# Patient Record
Sex: Male | Born: 1955 | Race: White | Hispanic: No | Marital: Married | Smoking: Never smoker
Health system: Southern US, Community
[De-identification: ages and names within clinical notes are randomized; demographics above are authoritative.]

## PROBLEM LIST (undated history)

## (undated) DIAGNOSIS — I10 Essential (primary) hypertension: Secondary | ICD-10-CM

---

## 2009-08-06 HISTORY — PX: COLONOSCOPY: SHX174

## 2014-07-07 DIAGNOSIS — M545 Low back pain, unspecified: Secondary | ICD-10-CM | POA: Insufficient documentation

## 2015-05-17 DIAGNOSIS — K439 Ventral hernia without obstruction or gangrene: Secondary | ICD-10-CM | POA: Insufficient documentation

## 2015-05-17 DIAGNOSIS — I10 Essential (primary) hypertension: Secondary | ICD-10-CM | POA: Insufficient documentation

## 2015-05-17 DIAGNOSIS — F4322 Adjustment disorder with anxiety: Secondary | ICD-10-CM | POA: Insufficient documentation

## 2015-05-17 DIAGNOSIS — K648 Other hemorrhoids: Secondary | ICD-10-CM | POA: Insufficient documentation

## 2016-12-11 DIAGNOSIS — H40033 Anatomical narrow angle, bilateral: Secondary | ICD-10-CM | POA: Insufficient documentation

## 2016-12-11 DIAGNOSIS — H2182 Plateau iris syndrome (post-iridectomy) (postprocedural): Secondary | ICD-10-CM | POA: Insufficient documentation

## 2016-12-11 DIAGNOSIS — H25813 Combined forms of age-related cataract, bilateral: Secondary | ICD-10-CM | POA: Insufficient documentation

## 2017-09-26 ENCOUNTER — Telehealth: Payer: Self-pay | Admitting: Cardiology

## 2017-09-26 ENCOUNTER — Encounter (HOSPITAL_BASED_OUTPATIENT_CLINIC_OR_DEPARTMENT_OTHER): Payer: Self-pay | Admitting: Emergency Medicine

## 2017-09-26 ENCOUNTER — Emergency Department (HOSPITAL_BASED_OUTPATIENT_CLINIC_OR_DEPARTMENT_OTHER): Payer: BLUE CROSS/BLUE SHIELD

## 2017-09-26 ENCOUNTER — Other Ambulatory Visit: Payer: Self-pay

## 2017-09-26 ENCOUNTER — Emergency Department (HOSPITAL_BASED_OUTPATIENT_CLINIC_OR_DEPARTMENT_OTHER)
Admission: EM | Admit: 2017-09-26 | Discharge: 2017-09-26 | Disposition: A | Discharge status: Home / Self Care | Payer: BLUE CROSS/BLUE SHIELD | Attending: Emergency Medicine | Admitting: Emergency Medicine

## 2017-09-26 DIAGNOSIS — Z79899 Other long term (current) drug therapy: Secondary | ICD-10-CM | POA: Insufficient documentation

## 2017-09-26 DIAGNOSIS — R000 Tachycardia, unspecified: Secondary | ICD-10-CM | POA: Diagnosis present

## 2017-09-26 DIAGNOSIS — I10 Essential (primary) hypertension: Secondary | ICD-10-CM | POA: Diagnosis not present

## 2017-09-26 DIAGNOSIS — I4892 Unspecified atrial flutter: Secondary | ICD-10-CM | POA: Insufficient documentation

## 2017-09-26 HISTORY — DX: Essential (primary) hypertension: I10

## 2017-09-26 LAB — CBC WITH DIFFERENTIAL/PLATELET
BASOS PCT: 0 %
Basophils Absolute: 0 10*3/uL (ref 0.0–0.1)
Eosinophils Absolute: 0.2 10*3/uL (ref 0.0–0.7)
Eosinophils Relative: 3 %
HEMATOCRIT: 44.4 % (ref 39.0–52.0)
HEMOGLOBIN: 14.7 g/dL (ref 13.0–17.0)
LYMPHS ABS: 2.1 10*3/uL (ref 0.7–4.0)
Lymphocytes Relative: 32 %
MCH: 31.3 pg (ref 26.0–34.0)
MCHC: 33.1 g/dL (ref 30.0–36.0)
MCV: 94.7 fL (ref 78.0–100.0)
MONO ABS: 0.9 10*3/uL (ref 0.1–1.0)
MONOS PCT: 13 %
NEUTROS ABS: 3.5 10*3/uL (ref 1.7–7.7)
Neutrophils Relative %: 52 %
Platelets: 333 10*3/uL (ref 150–400)
RBC: 4.69 MIL/uL (ref 4.22–5.81)
RDW: 12.5 % (ref 11.5–15.5)
WBC: 6.7 10*3/uL (ref 4.0–10.5)

## 2017-09-26 LAB — BASIC METABOLIC PANEL
ANION GAP: 9 (ref 5–15)
BUN: 14 mg/dL (ref 6–20)
CALCIUM: 9 mg/dL (ref 8.9–10.3)
CHLORIDE: 103 mmol/L (ref 101–111)
CO2: 26 mmol/L (ref 22–32)
CREATININE: 0.98 mg/dL (ref 0.61–1.24)
GFR calc Af Amer: >60 mL/min (ref >60)
GFR calc non Af Amer: >60 mL/min (ref >60)
GLUCOSE: 90 mg/dL (ref 65–99)
Potassium: 3.6 mmol/L (ref 3.5–5.1)
Sodium: 138 mmol/L (ref 135–145)

## 2017-09-26 LAB — TROPONIN I: Troponin I: <0.03 ng/mL (ref <0.03)

## 2017-09-26 MED ORDER — METOPROLOL SUCCINATE ER 25 MG PO TB24: 25.0000 mg | ORAL_TABLET | Freq: Every day | ORAL | 0 refills | Status: DC

## 2017-09-26 MED ORDER — RIVAROXABAN 20 MG PO TABS
20.0000 mg | ORAL_TABLET | Freq: Once | ORAL | Status: AC
  Administered 2017-09-26: 20 mg via ORAL
  Filled 2017-09-26: qty 1

## 2017-09-26 MED ORDER — RIVAROXABAN 20 MG PO TABS: 20.0000 mg | ORAL_TABLET | Freq: Every day | ORAL | 0 refills | Status: DC

## 2017-09-26 MED ORDER — DEXTROSE 5 % IV SOLN
5.0000 mg/h | INTRAVENOUS | Status: DC
  Administered 2017-09-26: 5 mg/h via INTRAVENOUS
  Filled 2017-09-26: qty 100

## 2017-09-26 MED ORDER — SODIUM CHLORIDE 0.9 % IV BOLUS (SEPSIS)
500.0000 mL | Freq: Once | INTRAVENOUS | Status: AC
  Administered 2017-09-26: 500 mL via INTRAVENOUS

## 2017-09-26 MED ORDER — DILTIAZEM LOAD VIA INFUSION
15.0000 mg | Freq: Once | INTRAVENOUS | Status: AC
  Administered 2017-09-26: 15 mg via INTRAVENOUS
  Filled 2017-09-26: qty 15

## 2017-09-26 MED ORDER — METOPROLOL TARTRATE 5 MG/5ML IV SOLN
5.0000 mg | Freq: Once | INTRAVENOUS | Status: AC
  Administered 2017-09-26: 5 mg via INTRAVENOUS
  Filled 2017-09-26: qty 5

## 2017-09-26 MED ORDER — METOPROLOL TARTRATE 50 MG PO TABS
25.0000 mg | ORAL_TABLET | Freq: Once | ORAL | Status: AC
Start: 2017-09-26 — End: 2017-09-26
  Administered 2017-09-26: 25 mg via ORAL

## 2017-09-26 MED ORDER — METOPROLOL TARTRATE 50 MG PO TABS
ORAL_TABLET | ORAL | Status: AC
  Filled 2017-09-26: qty 1

## 2017-09-26 MED ORDER — IOPAMIDOL (ISOVUE-300) INJECTION 61%
100.0000 mL | Freq: Once | INTRAVENOUS | Status: AC | PRN
  Administered 2017-09-26: 100 mL via INTRAVENOUS

## 2017-09-26 MED ORDER — RIVAROXABAN (XARELTO) EDUCATION KIT FOR AFIB PATIENTS
PACK | Freq: Once | Status: DC
  Filled 2017-09-26: qty 1

## 2017-09-26 MED ORDER — METOPROLOL SUCCINATE ER 25 MG PO TB24
25.0000 mg | ORAL_TABLET | Freq: Every day | ORAL | Status: DC
  Filled 2017-09-26: qty 1

## 2017-09-26 NOTE — ED Triage Notes (Signed)
Patient states that he went to his MD today for congestion vs fluid on his lungs. When he was at the MD they told him his HR was high and sent him to the ER

## 2017-09-26 NOTE — ED Notes (Addendum)
Patient refusing cardizem.  States his daughter is a physician and states he should not take this medicaiton.  EDP made aware.

## 2017-09-26 NOTE — Telephone Encounter (Signed)
Called by ER about patient with new onset atrial flutter after developing URI sx over the past week.  Presented to ER and found to be in new onset atrial flutter.  Given Cardizem IV and Lopressor and now HR 100bpm. Patient does not want to stay and wants to go home.  No ischemic changes on EKG.  Trop negative.  He has some chest congestion.   Instructed ER to start patient on Toprol XL 25mg  daily and Xarelto 20mg  daily.  Please set up OV tomorrow in afib clinic.

## 2017-09-26 NOTE — ED Notes (Signed)
EDP notified of patients BP.

## 2017-09-26 NOTE — ED Provider Notes (Addendum)
[facility] EMERGENCY DEPARTMENT Provider Note   CSN: [id number] Arrival date & time: 09/26/17  1517     History   Chief Complaint Chief Complaint  Patient presents with  . Tachycardia    HPI Patrick Ferguson is a 62 y.o. male.  Patient is a 62 year old male with a history of hypertension who presents with cough and tachycardia.  He states he has had a 3-week history of a cough.  It started with runny nose congestion and coughing and now he just has a lingering cough.  It is productive of clear sputum.  He has some tightness across his chest which he describes as a normal feeling when he has congestion in his chest.  He has no known fevers.  He does feel short of breath at times and fatigued more than normal over the last couple of weeks.  He went to his PCPs office today to be assessed for possible pneumonia and at that point they found that his heart rate was elevated and sent him to the emergency room for further evaluation.  He denies any leg pain or swelling.  No history of arrhythmias.  He states he had a stress test as part of a normal physical in November including a 24-hour Holter monitor which was normal per his report.  He denies any sensation of palpitations or heart racing.      Past Medical History:  Diagnosis Date  . Hypertension     There are no active problems to display for this patient.   History reviewed. No pertinent surgical history.     Home Medications    Prior to Admission medications   Medication Sig Start Date End Date Taking? Authorizing Provider  losartan (COZAAR) 50 MG tablet Take 50 mg by mouth daily.    [provider]  metoprolol succinate (TOPROL-XL) 25 MG 24 hr tablet Take 1 tablet (25 mg total) by mouth daily. 09/26/17   Malvin Johns, MD  rivaroxaban (XARELTO) 20 MG TABS tablet Take 1 tablet (20 mg total) by mouth daily with supper. 09/26/17   Malvin Johns, MD    Family History History reviewed. No pertinent family  history.  Social History Social History   Tobacco Use  . Smoking status: Never Smoker  . Smokeless tobacco: Never Used  Substance Use Topics  . Alcohol use: No    Frequency: Never  . Drug use: No     Allergies   Patient has no known allergies.   Review of Systems Review of Systems  Constitutional: Positive for fatigue. Negative for chills, diaphoresis and fever.  HENT: Negative for congestion, rhinorrhea and sneezing.   Eyes: Negative.   Respiratory: Positive for cough, chest tightness and shortness of breath.   Cardiovascular: Negative for chest pain and leg swelling.  Gastrointestinal: Negative for abdominal pain, blood in stool, diarrhea, nausea and vomiting.  Genitourinary: Negative for difficulty urinating, flank pain, frequency and hematuria.  Musculoskeletal: Negative for arthralgias and back pain.  Skin: Negative for rash.  Neurological: Negative for dizziness, speech difficulty, weakness, numbness and headaches.     Physical Exam Updated Vital Signs BP (!) 126/99   Pulse 66   Temp 98.3 F (36.8 C) (Oral)   Resp 15   Ht _0  (1.803 m)   Wt 82.6 kg (182 lb)   SpO2 98%   BMI 25.38 kg/m   Physical Exam  Constitutional: He is oriented to person, place, and time. He appears well-developed and well-nourished.  HENT:  Head: Normocephalic  and atraumatic.  Eyes: Pupils are equal, round, and reactive to light.  Neck: Normal range of motion. Neck supple.  Cardiovascular: Regular rhythm and normal heart sounds. Tachycardia present.  Pulmonary/Chest: Effort normal and breath sounds normal. No respiratory distress. He has no wheezes. He has no rales. He exhibits no tenderness.  Abdominal: Soft. Bowel sounds are normal. There is no tenderness. There is no rebound and no guarding.  Musculoskeletal: Normal range of motion. He exhibits no edema.  Lymphadenopathy:    He has no cervical adenopathy.  Neurological: He is alert and oriented to person, place, and time.    Skin: Skin is warm and dry. No rash noted.  Psychiatric: He has a normal mood and affect.     ED Treatments / Results  Labs (all labs ordered are listed, but only abnormal results are displayed) Labs Reviewed  BASIC METABOLIC PANEL  CBC WITH DIFFERENTIAL/PLATELET  TROPONIN I    EKG  EKG Interpretation  Date/Time:  Thursday September 26 2017 15:26:01 EST Ventricular Rate:  138 PR Interval:  164 QRS Duration: 134 QT Interval:  266 QTC Calculation: 402 R Axis:   31 Text Interpretation:  Unusual P axis, possible ectopic atrial tachycardia Non-specific intra-ventricular conduction block Nonspecific T wave abnormality Abnormal ECG No old tracing to compare Confirmed by Malvin Johns [phone]) on 09/26/2017 3:33:49 PM       Radiology Dg Chest 2 View  Result Date: 09/26/2017 CLINICAL DATA:  Cough and fatigue for several weeks EXAM: CHEST  2 VIEW COMPARISON:  None. FINDINGS: Cardiac shadow is within normal limits. The lungs are well aerated bilaterally. No definitive infiltrate is noted. Some increased density is noted in the right paratracheal region projecting over the aortic arch on the lateral projection. This may simply represent vascular structures although the possibility of an underlying lesion could not be totally excluded. CT T is recommended for further evaluation. IMPRESSION: Changes in the right paratracheal region. CT of the chest with contrast is recommended. Electronically Signed   By: Inez Catalina M.D.   On: 09/26/2017 16:04   Ct Chest W Contrast  Result Date: 09/26/2017 CLINICAL DATA:  62 year old male with cough and fatigue for several weeks. Possible right paratracheal abnormality on chest radiographs today. EXAM: CT CHEST WITH CONTRAST TECHNIQUE: Multidetector CT imaging of the chest was performed during intravenous contrast administration. CONTRAST:  75 milliliters ISOVUE-300 IOPAMIDOL (ISOVUE-300) INJECTION 61% COMPARISON:  Chest radiographs at 1614 hr today, with no  earlier studies for comparison. FINDINGS: Cardiovascular: Negative visible aorta. The proximal great vessels appear normal. Evidence of calcified left coronary artery atherosclerosis on series 2, image 90. The other major mediastinal vascular structures appear within normal limits. Borderline to mild cardiomegaly. No pericardial effusion. Mediastinum/Nodes: Negative. No lymphadenopathy. Unremarkable thoracic inlet. Lungs/Pleura: The major airways are patent. There is no medial right upper lobe or right paratracheal pulmonary abnormality. The radiographic appearance from earlier today was artifact. There is minimal dependent opacity in both lungs due to atelectasis. Otherwise both lungs are clear. No pleural effusion. Upper Abdomen: Negative visible liver, gallbladder, spleen, pancreas, adrenal glands, and bowel in the upper abdomen. There is a large left mid and upper pole renal cyst encompassing about 8 centimeters diameter which has simple fluid density aside from several small calcified septations (series 5, image 67). This appears benign. There is a partially visible low-density right renal midpole cyst laterally which is also likely benign. No upper abdominal lymphadenopathy. Musculoskeletal: Degenerative changes in the upper lumbar spine. No acute osseous abnormality  identified. IMPRESSION: 1. Negative Chest CT aside from evidence of left coronary artery calcified atherosclerosis. 2. The right paratracheal radiographic finding earlier today was artifact. 3. Large left upper pole renal cyst which appears benign, several thin calcified septae within the cyst are noted. Electronically Signed   By: Genevie Ann M.D.   On: 09/26/2017 17:19    Procedures Procedures (including critical care time)  Medications Ordered in ED Medications  diltiazem (CARDIZEM) 1 mg/mL load via infusion 15 mg (15 mg Intravenous Bolus from Bag 09/26/17 1746)    And  diltiazem (CARDIZEM) 100 mg in dextrose 5 % 100 mL (1 mg/mL) infusion  (0 mg/hr Intravenous Stopped 09/26/17 2008)  rivaroxaban (XARELTO) Education Kit for Afib patients ( Does not apply Not Given 09/26/17 2025)  sodium chloride 0.9 % bolus 500 mL (0 mLs Intravenous Stopped 09/26/17 1746)  iopamidol (ISOVUE-300) 61 % injection 100 mL (100 mLs Intravenous Contrast Given 09/26/17 1653)  metoprolol tartrate (LOPRESSOR) injection 5 mg (5 mg Intravenous Given 09/26/17 1831)  rivaroxaban (XARELTO) tablet 20 mg (20 mg Oral Given 09/26/17 2017)  metoprolol tartrate (LOPRESSOR) tablet 25 mg (25 mg Oral Given 09/26/17 2018)     Initial Impression / Assessment and Plan / ED Course  I have reviewed the triage vital signs and the nursing notes.  Pertinent labs & imaging results that were available during my care of the patient were reviewed by me and considered in my medical decision making (see chart for details).  Clinical Course as of Sep 26 2024  Thu Sep 26, 2017  1640 PT has tachycardia in the setting of recent cough/chest congestion.  EKG appears to be possible atrial tachycardia vs a-flutter.  Cardizem ordered, but pt talked to his daughter who is an ob/gyn resident who wants to try fluids first.  Giving 500cc bolus.  Cxr reviewed and shows fullness to paratracheal area, CT recommended.  Pt updated  [MB]  1742 No change in HR after fluids.  Had a discussion with the pt's daughter regarding starting cardizem and she wants to make some phone calls prior to starting this.  I explained that this is generally a rate control medication rather than a conversion medication but occasionally patients will convert as the HR improves.    [MB]  1857 Pt agreed to cardizem, no change in HR after bolus and drip.  Given lopressor 9m IV, HR slowed to low 100s, in atrial flutter.    [MB]    Clinical Course User Index [MB] BMalvin Johns MD    Patient is a 62year old male who presents in a flutter with RVR.  He currently is asymptomatic.  There is no signs of CHF.  He initially was  reluctant to start rate control medications but then did agree to this.  He was started on Cardizem with minimal change in heart rate.  He was given a dose of Lopressor and his heart rate improved nicely down into the low 100s.  He remains asymptomatic.  I discussed with him admission and patient is reluctant to be admitted.  He he wants to see if he can go home.  I spoke with Dr. TRadford Paxwith cardiology who feels that this would be appropriate.  She recommends starting him on Toprol 25 mg daily as well as Xarelto.  I discussed these medications and the side effects.  Dr. TRadford Paxis sending a note to the A. fib clinic to hopefully get him in to be seen tomorrow.  I did advise the patient that  if he does not hear from the clinic by 11 AM to call the clinic for an appointment tomorrow.  Return precautions were given.  CRITICAL CARE Performed by: Malvin Johns Total critical care time: 90 minutes Critical care time was exclusive of separately billable procedures and treating other patients. Critical care was necessary to treat or prevent imminent or life-threatening deterioration. Critical care was time spent personally by me on the following activities: development of treatment plan with patient and/or surrogate as well as nursing, discussions with consultants, evaluation of patient's response to treatment, examination of patient, obtaining history from patient or surrogate, ordering and performing treatments and interventions, ordering and review of laboratory studies, ordering and review of radiographic studies, pulse oximetry and re-evaluation of patient's condition.  This patients CHA2DS2-VASc Score and unadjusted Ischemic Stroke Rate (% per year) is equal to 0.6 % stroke rate/year from a score of 1  Above score calculated as 1 point each if present [CHF, HTN, DM, Vascular=MI/PAD/Aortic Plaque, Age if 65-74, or Male] Above score calculated as 2 points each if present [Age > 75, or  Stroke/TIA/TE]     Final Clinical Impressions(s) / ED Diagnoses   Final diagnoses:  Atrial flutter with rapid ventricular response [facility])    ED Discharge Orders        Ordered    metoprolol succinate (TOPROL-XL) 25 MG 24 hr tablet  Daily     09/26/17 1956    rivaroxaban (XARELTO) 20 MG TABS tablet  Daily with supper     09/26/17 1956       Malvin Johns, MD 09/26/17 1958    Malvin Johns, MD 09/26/17 2026

## 2017-09-26 NOTE — ED Notes (Signed)
Per EDP hold on cardizem.  IV fluids ordered and given.

## 2017-09-26 NOTE — ED Notes (Signed)
ED Provider at bedside. 

## 2017-09-26 NOTE — ED Notes (Signed)
Patient continues to refuse cardizem.  States "I have to make some phone calls". EDP aware.

## 2017-09-27 ENCOUNTER — Ambulatory Visit (INDEPENDENT_AMBULATORY_CARE_PROVIDER_SITE_OTHER): Payer: BLUE CROSS/BLUE SHIELD | Admitting: Internal Medicine

## 2017-09-27 ENCOUNTER — Encounter: Payer: Self-pay | Admitting: Internal Medicine

## 2017-09-27 VITALS — BP 132/96 | HR 136 | Ht 71.0 in | Wt 185.8 lb

## 2017-09-27 DIAGNOSIS — I2510 Atherosclerotic heart disease of native coronary artery without angina pectoris: Secondary | ICD-10-CM

## 2017-09-27 DIAGNOSIS — I10 Essential (primary) hypertension: Secondary | ICD-10-CM | POA: Diagnosis not present

## 2017-09-27 DIAGNOSIS — E782 Mixed hyperlipidemia: Secondary | ICD-10-CM

## 2017-09-27 DIAGNOSIS — I4892 Unspecified atrial flutter: Secondary | ICD-10-CM | POA: Diagnosis not present

## 2017-09-27 MED ORDER — METOPROLOL TARTRATE 50 MG PO TABS: 50.0000 mg | ORAL_TABLET | Freq: Two times a day (BID) | ORAL | 3 refills | Status: DC

## 2017-09-27 MED ORDER — RIVAROXABAN 20 MG PO TABS: 20.0000 mg | ORAL_TABLET | Freq: Every day | ORAL | 0 refills | Status: DC

## 2017-09-27 NOTE — Telephone Encounter (Signed)
A. FIB clinic is not open today. Dr. Radford Pax stated patient needed to be seen by DOD or APP today. Made patient an appointment with Dr. Harrington Challenger, [place], today. Sent message to [facility] for records.

## 2017-09-27 NOTE — Progress Notes (Signed)
Cardiology Office Note   Date:  09/27/2017   ID:  Patrick Ferguson, DOB Sep 03, 1955, MRN [id number]  PCP:  Deland Pretty, MD  Cardiologist:   Dorris Carnes, MD    [name]    History of Present Illness: Patrick Ferguson is a 62 y.o. male with a history of HTN  Seen yesterday in ED at [facility] yesterday (2/21)  He had 3 wk hsitory of cough, runny nose.  Had some tightnessattrib to congeston.  Did complain of SOB with exertion.   Went to PCP office prior to ED and sent to ED   Found to be in atrial flutter  He denied palpitations  .   Given cardiazem and lopressor and HR improved  DId not want to stay.  Troponin negative  He was discharged on Toprol XL 25 and Xarelto 20    Since ED visit he has not started meds   He has not taken bp meds before iether (even  Though prescribed)  He denies ddzziness  No palpitations  Breathing is fair       Current Meds  Medication Sig  . losartan (COZAAR) 50 MG tablet Take 50 mg by mouth daily.  . metoprolol succinate (TOPROL-XL) 25 MG 24 hr tablet Take 1 tablet (25 mg total) by mouth daily.  . rivaroxaban (XARELTO) 20 MG TABS tablet Take 1 tablet (20 mg total) by mouth daily with supper.     Allergies:   Patient has no known allergies.   Past Medical History:  Diagnosis Date  . Hypertension     History reviewed. No pertinent surgical history.   Social History:  The patient  reports that  has never smoked. he has never used smokeless tobacco. He reports that he does not drink alcohol or use drugs.   Family History:  The patient's family history is not on file.    ROS:  Please see the history of present illness. All other systems are reviewed and  Negative to the above problem except as noted.    PHYSICAL EXAM: VS:  BP (!) 132/96   Pulse (!) 136   Ht 5\' 11"  (1.803 m)   Wt 185 lb 12.8 oz (84.3 kg)   BMI 25.91 kg/m   GEN: Well nourished, well developed, in no acute distress  HEENT:  normal  Neck: JVP normal  carotid bruits, or masses Cardiac: RRR  Tachy ; no murmurs, rubs, or gallops,no edema  Respiratory:  clear to auscultation bilaterally, normal work of breathing GI: soft, nontender, nondistended, + BS  No hepatomegaly  MS: no deformity Moving all extremities   Skin: warm and dry, no rash Neuro:  Strength and sensation are intact Psych: euthymic mood, full affect   EKG:  EKG is ordered today.   Lipid Panel No results found for: CHOL, TRIG, HDL, CHOLHDL, VLDL, LDLCALC, LDLDIRECT    Wt Readings from Last 3 Encounters:  09/27/17 185 lb 12.8 oz (84.3 kg)  09/26/17 182 lb (82.6 kg)      ASSESSMENT AND PLAN:  1  Atrial flutter . Discussed pathophysiology and Rx options at length with pt and his daugher (an OB GYN resident)   Answered questions   Plan for cardioversion in a few wks    Pt with CHADSVASC score of 2 (HTN, CAD on CT)   I would reocmm Xarelto and metoprolol   He has not taken either yet and is still tachy. I would recomm  starting metoprolol 25 tid then increase to 50 bid Follow BP and pulse    Schedule echo.     Plan for cardioversion 3 to 4 wks from starting xarelto  Will see in clnic after cardioversion and discuss options for anticoag as well as ablation in future.  2  Coronary calcifications    CT shows calcifications of coronary arteries   He is not symptomatic  3  Lipids   Will set up for fasting    4  HTN  Follow on Rx      Current medicines are reviewed at length with the patient today.  The patient does not have concerns regarding medicines.  Signed, Dorris Carnes, MD  09/27/2017 11:18 AM    [name], [place], [place]  [address] Phone: [phone]; Fax: [phone]

## 2017-09-27 NOTE — H&P (View-Only) (Signed)
Cardiology Office Note   Date:  09/27/2017   ID:  Patrick Ferguson, DOB Nov 20, 1955, MRN [id number]  PCP:  Deland Pretty, MD  Cardiologist:   Dorris Carnes, MD    [name]    History of Present Illness: Patrick Ferguson is a 62 y.o. male with a history of HTN  Seen yesterday in ED at [facility] yesterday (2/21)  He had 3 wk hsitory of cough, runny nose.  Had some tightnessattrib to congeston.  Did complain of SOB with exertion.   Went to PCP office prior to ED and sent to ED   Found to be in atrial flutter  He denied palpitations  .   Given cardiazem and lopressor and HR improved  DId not want to stay.  Troponin negative  He was discharged on Toprol XL 25 and Xarelto 20    Since ED visit he has not started meds   He has not taken bp meds before iether (even  Though prescribed)  He denies ddzziness  No palpitations  Breathing is fair       Current Meds  Medication Sig  . losartan (COZAAR) 50 MG tablet Take 50 mg by mouth daily.  . metoprolol succinate (TOPROL-XL) 25 MG 24 hr tablet Take 1 tablet (25 mg total) by mouth daily.  . rivaroxaban (XARELTO) 20 MG TABS tablet Take 1 tablet (20 mg total) by mouth daily with supper.     Allergies:   Patient has no known allergies.   Past Medical History:  Diagnosis Date  . Hypertension     History reviewed. No pertinent surgical history.   Social History:  The patient  reports that  has never smoked. he has never used smokeless tobacco. He reports that he does not drink alcohol or use drugs.   Family History:  The patient's family history is not on file.    ROS:  Please see the history of present illness. All other systems are reviewed and  Negative to the above problem except as noted.    PHYSICAL EXAM: VS:  BP (!) 132/96   Pulse (!) 136   Ht 5\' 11"  (1.803 m)   Wt 185 lb 12.8 oz (84.3 kg)   BMI 25.91 kg/m   GEN: Well nourished, well developed, in no acute distress  HEENT:  normal  Neck: JVP normal  carotid bruits, or masses Cardiac: RRR  Tachy ; no murmurs, rubs, or gallops,no edema  Respiratory:  clear to auscultation bilaterally, normal work of breathing GI: soft, nontender, nondistended, + BS  No hepatomegaly  MS: no deformity Moving all extremities   Skin: warm and dry, no rash Neuro:  Strength and sensation are intact Psych: euthymic mood, full affect   EKG:  EKG is ordered today.   Lipid Panel No results found for: CHOL, TRIG, HDL, CHOLHDL, VLDL, LDLCALC, LDLDIRECT    Wt Readings from Last 3 Encounters:  09/27/17 185 lb 12.8 oz (84.3 kg)  09/26/17 182 lb (82.6 kg)      ASSESSMENT AND PLAN:  1  Atrial flutter . Discussed pathophysiology and Rx options at length with pt and his daugher (an OB GYN resident)   Answered questions   Plan for cardioversion in a few wks    Pt with CHADSVASC score of 2 (HTN, CAD on CT)   I would reocmm Xarelto and metoprolol   He has not taken either yet and is still tachy. I would recomm  starting metoprolol 25 tid then increase to 50 bid Follow BP and pulse    Schedule echo.     Plan for cardioversion 3 to 4 wks from starting xarelto  Will see in clnic after cardioversion and discuss options for anticoag as well as ablation in future.  2  Coronary calcifications    CT shows calcifications of coronary arteries   He is not symptomatic  3  Lipids   Will set up for fasting    4  HTN  Follow on Rx      Current medicines are reviewed at length with the patient today.  The patient does not have concerns regarding medicines.  Signed, Dorris Carnes, MD  09/27/2017 11:18 AM    [address], [place], [place]  [address] Phone: [phone]; Fax: [phone]

## 2017-09-27 NOTE — Patient Instructions (Signed)
Medication Instructions:  Your physician has recommended you make the following change in your medication:  1.) stop metoprolol succinate 2.) start metoprolol tartrate 50 mg twice daily---take 1/2 tablet for first dose Take xarelto 20 mg with supper daily.  Do not miss any doses.   Labwork: Your physician recommends that you return for lab work when you come for echo appointment (lipids)   Testing/Procedures: Your physician has requested that you have an echocardiogram. Echocardiography is a painless test that uses sound waves to create images of your heart. It provides your doctor with information about the size and shape of your heart and how well your heart's chambers and valves are working. This procedure takes approximately one hour. There are no restrictions for this procedure.    Follow-Up: Your physician has recommended that you have a Cardioversion (DCCV). Electrical Cardioversion uses a jolt of electricity to your heart either through paddles or wired patches attached to your chest. This is a controlled, usually prescheduled, procedure. Defibrillation is done under light anesthesia in the hospital, and you usually go home the day of the procedure. This is done to get your heart back into a normal rhythm. You are not awake for the procedure. Please see the instruction sheet given to you today.  We will plan this for in about 3-4 weeks.  We will schedule a follow up with for you with Dr. Harrington Challenger about 3 weeks after cardioversion.      Any Other Special Instructions Will Be Listed Below (If Applicable).     If you need a refill on your cardiac medications before your next appointment, please call your pharmacy.

## 2017-10-02 ENCOUNTER — Telehealth: Payer: Self-pay | Admitting: Internal Medicine

## 2017-10-02 NOTE — Telephone Encounter (Signed)
Spoke with pt and daughter regarding pt . Pt was seen in the ER on 2/21. Pt was diagnosed with A-flutter . Pt was prescribed Metoprolol tartrate 25 mg BID then. Pt was seen by Dr Harrington Challenger on 09/27/17. Pt was still on A-flutter rate of 136 beats/minute, BP 132/96. Metoprolol tartrate dose was increased to 50 mg BID. Pt started take the 50 mg this past Sunday. Pt responded well at the beginning to the increased dose. HR was in the lowest 90's. Last night and this morning prior going to work pt's HR was 135 beats/minute BP 127/105. No other symptoms, pt feels fine.  Pt would like to know if he can increased the Metoprolol dose.  Dr. Rayann Heman DOD aware of pt's increased HR. DOD  recommends to increased the Metoprolol tartrate to 75 mg BID. pt's daughter aware, she verbalized understanding.

## 2017-10-02 NOTE — Telephone Encounter (Signed)
New message     Daughter calling with HR and BP concerns   STAT if HR is under 50 or over 120 (normal HR is 60-100 beats per minute)  1) What is your heart rate? 135 BP 127/105  2) Do you have a log of your heart rate readings (document readings)?   3) Do you have any other symptoms? NO

## 2017-10-02 NOTE — Telephone Encounter (Signed)
Spoke to pt  Agree with 75 bid He wll email in at end of week\\  Will need to confirm time for cardioversion (3-4 wks from initiating NOAC)

## 2017-10-03 MED ORDER — METOPROLOL TARTRATE 25 MG PO TABS: ORAL_TABLET | ORAL | 3 refills | Status: DC

## 2017-10-03 NOTE — Telephone Encounter (Signed)
Sent a prescription for 25 mg tablet to [facility].  Pt can take 25 mg in addition to the 50 mg tablets he already has. Called patient and informed.

## 2017-10-03 NOTE — Telephone Encounter (Signed)
He will email at end of week with blood pressures and HR.

## 2017-10-04 ENCOUNTER — Encounter: Payer: Self-pay | Admitting: Internal Medicine

## 2017-10-04 ENCOUNTER — Telehealth: Payer: Self-pay | Admitting: Internal Medicine

## 2017-10-04 NOTE — Telephone Encounter (Signed)
Patient calling,   STAT if HR is under 50 or over 120 (normal HR is 60-100 beats per minute)  1) What is your heart rate? 132 and 131   2) Do you have a log of your heart rate readings (document readings)? yes  3) Do you have any other symptoms? Fatigue   BP last four: 124/93 hr 132 / 124/96 hr 131/ 124/93 hr 132 / 129/95 HR 131   Patient would like to know if Metoprolol should be adjusted?

## 2017-10-07 ENCOUNTER — Encounter: Payer: Self-pay | Admitting: Internal Medicine

## 2017-10-07 ENCOUNTER — Encounter: Payer: Self-pay | Admitting: *Deleted

## 2017-10-07 NOTE — Telephone Encounter (Signed)
Spoke to pt earlier Will increase lopressor to 100 bid Tentatively sched for TEE/ Cardioversion on Thursday this week

## 2017-10-07 NOTE — Progress Notes (Signed)
Patient has been set up for TEE/Cardioversion for Thurs 10/10/17 per Dr. Harrington Challenger. He is aware of all instructions, letter has been set to him via MyChart also. He is working on someone being able to wait for him and take him home.   He will have labs drawn prior to procedure that morning, arriving 90 min early. Message sent to [employer].  Advised to call office with any questions/concerns in the meantime.

## 2017-10-10 ENCOUNTER — Ambulatory Visit (HOSPITAL_COMMUNITY)
Admission: RE | Admit: 2017-10-10 | Discharge: 2017-10-10 | Disposition: A | Discharge status: Home / Self Care | Payer: BLUE CROSS/BLUE SHIELD | Source: Ambulatory Visit | Attending: Cardiology | Admitting: Cardiology

## 2017-10-10 ENCOUNTER — Ambulatory Visit (HOSPITAL_BASED_OUTPATIENT_CLINIC_OR_DEPARTMENT_OTHER): Payer: BLUE CROSS/BLUE SHIELD

## 2017-10-10 ENCOUNTER — Encounter (HOSPITAL_COMMUNITY)
Admission: RE | Disposition: A | Discharge status: Home / Self Care | Payer: Self-pay | Source: Ambulatory Visit | Attending: Cardiology

## 2017-10-10 ENCOUNTER — Telehealth: Payer: Self-pay | Admitting: Internal Medicine

## 2017-10-10 ENCOUNTER — Other Ambulatory Visit: Payer: BLUE CROSS/BLUE SHIELD

## 2017-10-10 ENCOUNTER — Other Ambulatory Visit: Payer: Self-pay | Admitting: Cardiology

## 2017-10-10 ENCOUNTER — Ambulatory Visit (HOSPITAL_COMMUNITY): Payer: BLUE CROSS/BLUE SHIELD

## 2017-10-10 ENCOUNTER — Encounter (HOSPITAL_COMMUNITY): Payer: Self-pay | Admitting: *Deleted

## 2017-10-10 ENCOUNTER — Other Ambulatory Visit: Payer: Self-pay

## 2017-10-10 DIAGNOSIS — I10 Essential (primary) hypertension: Secondary | ICD-10-CM | POA: Insufficient documentation

## 2017-10-10 DIAGNOSIS — I4892 Unspecified atrial flutter: Secondary | ICD-10-CM | POA: Insufficient documentation

## 2017-10-10 DIAGNOSIS — Z7901 Long term (current) use of anticoagulants: Secondary | ICD-10-CM | POA: Insufficient documentation

## 2017-10-10 DIAGNOSIS — R0989 Other specified symptoms and signs involving the circulatory and respiratory systems: Secondary | ICD-10-CM

## 2017-10-10 DIAGNOSIS — I2510 Atherosclerotic heart disease of native coronary artery without angina pectoris: Secondary | ICD-10-CM | POA: Diagnosis not present

## 2017-10-10 DIAGNOSIS — I340 Nonrheumatic mitral (valve) insufficiency: Secondary | ICD-10-CM | POA: Diagnosis not present

## 2017-10-10 DIAGNOSIS — Z79899 Other long term (current) drug therapy: Secondary | ICD-10-CM | POA: Diagnosis not present

## 2017-10-10 DIAGNOSIS — I4891 Unspecified atrial fibrillation: Secondary | ICD-10-CM | POA: Insufficient documentation

## 2017-10-10 HISTORY — PX: CARDIOVERSION: SHX1299

## 2017-10-10 HISTORY — PX: TEE WITHOUT CARDIOVERSION: SHX5443

## 2017-10-10 LAB — POCT I-STAT, CHEM 8
BUN: 17 mg/dL (ref 6–20)
CALCIUM ION: 1.17 mmol/L (ref 1.15–1.40)
CHLORIDE: 107 mmol/L (ref 101–111)
Creatinine, Ser: 1.3 mg/dL — ABNORMAL HIGH (ref 0.61–1.24)
GLUCOSE: 101 mg/dL — AB (ref 65–99)
HCT: 44 % (ref 39.0–52.0)
Hemoglobin: 15 g/dL (ref 13.0–17.0)
Potassium: 4.3 mmol/L (ref 3.5–5.1)
SODIUM: 142 mmol/L (ref 135–145)
TCO2: 24 mmol/L (ref 22–32)

## 2017-10-10 SURGERY — ECHOCARDIOGRAM, TRANSESOPHAGEAL
Anesthesia: Monitor Anesthesia Care

## 2017-10-10 MED ORDER — SODIUM CHLORIDE 0.9 % IV SOLN
INTRAVENOUS | Status: DC
  Administered 2017-10-10: 500 mL via INTRAVENOUS

## 2017-10-10 MED ORDER — SODIUM CHLORIDE 0.9 % IV SOLN
INTRAVENOUS | Status: DC | PRN
  Administered 2017-10-10: 11:00:00 via INTRAVENOUS

## 2017-10-10 MED ORDER — PROPOFOL 500 MG/50ML IV EMUL
INTRAVENOUS | Status: DC | PRN
  Administered 2017-10-10: 120 ug/kg/min via INTRAVENOUS

## 2017-10-10 MED ORDER — BUTAMBEN-TETRACAINE-BENZOCAINE 2-2-14 % EX AERO
INHALATION_SPRAY | CUTANEOUS | Status: DC | PRN
  Administered 2017-10-10: 2 via TOPICAL

## 2017-10-10 NOTE — Procedures (Signed)
Electrical Cardioversion Procedure Note Patrick Ferguson [id number] 1956/08/06  Procedure: Electrical Cardioversion Indications:  Atrial Flutter  Procedure Details Consent: Risks of procedure as well as the alternatives and risks of each were explained to the (patient/caregiver).  Consent for procedure obtained. Time Out: Verified patient identification, verified procedure, site/side was marked, verified correct patient position, special equipment/implants available, medications/allergies/relevent history reviewed, required imaging and test results available.  Performed  Patient placed on cardiac monitor, pulse oximetry, supplemental oxygen as necessary.  Sedation given: Propofol per anesthesiology Pacer pads placed anterior and posterior chest.  Cardioverted 1 time(s).  Cardioverted at [place].  Evaluation Findings: Post procedure EKG shows: NSR Complications: None Patient did tolerate procedure well.   Loralie Champagne 10/10/2017, 11:33 AM

## 2017-10-10 NOTE — Telephone Encounter (Signed)
Dr. Harrington Challenger called patient back this afternoon.

## 2017-10-10 NOTE — Interval H&P Note (Signed)
History and Physical Interval Note:  10/10/2017 11:18 AM  Patrick Ferguson  has presented today for surgery, with the diagnosis of AFLUTTER  The various methods of treatment have been discussed with the patient and family. After consideration of risks, benefits and other options for treatment, the patient has consented to  Procedure(s): TRANSESOPHAGEAL ECHOCARDIOGRAM (TEE) (N/A) CARDIOVERSION (N/A) as a surgical intervention .  The patient's history has been reviewed, patient examined, no change in status, stable for surgery.  I have reviewed the patient's chart and labs.  Questions were answered to the patient's satisfaction.     Dalton [employer]

## 2017-10-10 NOTE — CV Procedure (Signed)
Procedure: TEE  Indication: atrial fibrillation  Sedation: Per anesthesiology.  Findings: Please see echo section for full report.  Normal LV size with global hypokinesis, EF 30%, in the setting of atrial flutter with RVR.  Normal RV size with mild to moderately decreased systolic function.  Mild TR.  Mild MR.  Trileaflet aortic valve with no stenosis or regurgitation  Mild left atrial enlargement, no LA appendage thrombus.  Normal right atrium.  Normal caliber aorta with mild plaque.  No PFO or ASD by color doppler.   May proceed to DCCV.   Loralie Champagne 10/10/2017 11:33 AM

## 2017-10-10 NOTE — Progress Notes (Signed)
  Echocardiogram Echocardiogram Transesophageal has been performed.  Darlina Sicilian M 10/10/2017, 11:52 AM

## 2017-10-10 NOTE — Telephone Encounter (Signed)
Patient calling states that he had Cardioversion today and was told to contact Dr. Harrington Challenger office

## 2017-10-10 NOTE — Discharge Instructions (Signed)
Electrical Cardioversion, Care After °This sheet gives you information about how to care for yourself after your procedure. Your health care provider may also give you more specific instructions. If you have problems or questions, contact your health care provider. °What can I expect after the procedure? °After the procedure, it is common to have: °· Some redness on the skin where the shocks were given. ° °Follow these instructions at home: °· Do not drive for 24 hours if you were given a medicine to help you relax (sedative). °· Take over-the-counter and prescription medicines only as told by your health care provider. °· Ask your health care provider how to check your pulse. Check it often. °· Rest for 48 hours after the procedure or as told by your health care provider. °· Avoid or limit your caffeine use as told by your health care provider. °Contact a health care provider if: °· You feel like your heart is beating too quickly or your pulse is not regular. °· You have a serious muscle cramp that does not go away. °Get help right away if: °· You have discomfort in your chest. °· You are dizzy or you feel faint. °· You have trouble breathing or you are short of breath. °· Your speech is slurred. °· You have trouble moving an arm or leg on one side of your body. °· Your fingers or toes turn cold or blue. °This information is not intended to replace advice given to you by your health care provider. Make sure you discuss any questions you have with your health care provider. °Document Released: 05/13/2013 Document Revised: 02/24/2016 Document Reviewed: 01/27/2016 °Elsevier Interactive Patient Education © 2018 [employer] Inc[employer] ° °

## 2017-10-10 NOTE — Anesthesia Procedure Notes (Signed)
Procedure Name: MAC Date/Time: 10/10/2017 11:06 AM Performed by: Kyung Rudd, CRNA Pre-anesthesia Checklist: Patient identified, Emergency Drugs available, Suction available and Patient being monitored Patient Re-evaluated:Patient Re-evaluated prior to induction Oxygen Delivery Method: Nasal cannula Preoxygenation: Pre-oxygenation with 100% oxygen Induction Type: IV induction Placement Confirmation: positive ETCO2

## 2017-10-10 NOTE — Anesthesia Preprocedure Evaluation (Addendum)
Anesthesia Evaluation  Patient identified by MRN, date of birth, ID band Patient awake    Reviewed: Allergy & Precautions, H&P , NPO status , Patient's Chart, lab work & pertinent test results, reviewed documented beta blocker date and time   Airway Mallampati: II  TM Distance: >3 FB Neck ROM: full    Dental no notable dental hx. (+) Teeth Intact   Pulmonary    Pulmonary exam normal breath sounds clear to auscultation       Cardiovascular hypertension,  Rhythm:regular Rate:Normal     Neuro/Psych    GI/Hepatic   Endo/Other    Renal/GU      Musculoskeletal   Abdominal   Peds  Hematology   Anesthesia Other Findings   Reproductive/Obstetrics                            Anesthesia Physical Anesthesia Plan  ASA: II  Anesthesia Plan: MAC   Post-op Pain Management:    Induction: Intravenous  PONV Risk Score and Plan:   Airway Management Planned: Mask and Natural Airway  Additional Equipment:   Intra-op Plan:   Post-operative Plan:   Informed Consent: I have reviewed the patients History and Physical, chart, labs and discussed the procedure including the risks, benefits and alternatives for the proposed anesthesia with the patient or authorized representative who has indicated his/her understanding and acceptance.   Dental Advisory Given  Plan Discussed with: CRNA and Surgeon  Anesthesia Plan Comments:         Anesthesia Quick Evaluation

## 2017-10-10 NOTE — Transfer of Care (Signed)
Immediate Anesthesia Transfer of Care Note  Patient: Patrick Ferguson  Procedure(s) Performed: TRANSESOPHAGEAL ECHOCARDIOGRAM (TEE) (N/A ) CARDIOVERSION (N/A )  Patient Location: Endoscopy Unit  Anesthesia Type:MAC  Level of Consciousness: awake, alert  and oriented  Airway & Oxygen Therapy: Patient Spontanous Breathing and Patient connected to nasal cannula oxygen  Post-op Assessment: Report given to RN and Post -op Vital signs reviewed and stable  Post vital signs: Reviewed and stable  Last Vitals:  Vitals:   10/10/17 0959  BP: (!) 140/100  Resp: (!) 22  Temp: 36.6 C  SpO2: 96%    Last Pain:  Vitals:   10/10/17 0959  TempSrc: Oral         Complications: No apparent anesthesia complications

## 2017-10-11 ENCOUNTER — Telehealth: Payer: Self-pay | Admitting: Internal Medicine

## 2017-10-11 ENCOUNTER — Encounter: Payer: Self-pay | Admitting: Internal Medicine

## 2017-10-11 DIAGNOSIS — I4891 Unspecified atrial fibrillation: Secondary | ICD-10-CM

## 2017-10-11 MED ORDER — METOPROLOL TARTRATE 50 MG PO TABS: 25.0000 mg | ORAL_TABLET | Freq: Two times a day (BID) | ORAL | Status: DC

## 2017-10-11 NOTE — Telephone Encounter (Signed)
Sent my chart message to patient confirming metoprolol dose change. Updated medication list. Scheduled for f/u with PR and for full echo in 4 week. Provided these appointments to patient in [place] message. Advised to monitor bp and hr and bring list of readings to next appointment.

## 2017-10-11 NOTE — Telephone Encounter (Signed)
Spoke to patient  I hav reviewed BP recordings he has sent in I would recomm metoprolol 25 bid for now Follow BP and P  Please add him in to my sched on March 21 (overbook)  Tell him to bring cuff and readings at that time  Go ahead and get full echo in about 4 wks  Comparison in LVEF can be made to TEE at that time

## 2017-10-11 NOTE — Addendum Note (Signed)
Addended by: Rodman Key on: 10/11/2017 03:47 PM   Modules accepted: Orders

## 2017-10-11 NOTE — Anesthesia Postprocedure Evaluation (Addendum)
Anesthesia Post Note  Patient: Patrick Ferguson  Procedure(s) Performed: TRANSESOPHAGEAL ECHOCARDIOGRAM (TEE) (N/A ) CARDIOVERSION (N/A )     Patient location during evaluation: PACU Anesthesia Type: MAC Level of consciousness: awake and alert Pain management: pain level controlled Vital Signs Assessment: post-procedure vital signs reviewed and stable Respiratory status: spontaneous breathing, nonlabored ventilation, respiratory function stable and patient connected to nasal cannula oxygen Cardiovascular status: stable and blood pressure returned to baseline Postop Assessment: no apparent nausea or vomiting Anesthetic complications: no    Last Vitals: There were no vitals filed for this visit.  Last Pain: There were no vitals filed for this visit. Pain Goal:                 Riccardo Dubin

## 2017-10-13 ENCOUNTER — Encounter: Payer: Self-pay | Admitting: Internal Medicine

## 2017-10-21 ENCOUNTER — Encounter: Payer: Self-pay | Admitting: Internal Medicine

## 2017-10-22 ENCOUNTER — Other Ambulatory Visit: Payer: Self-pay | Admitting: *Deleted

## 2017-10-22 MED ORDER — RIVAROXABAN 20 MG PO TABS: 20.0000 mg | ORAL_TABLET | Freq: Every day | ORAL | 5 refills | Status: DC

## 2017-10-22 NOTE — Telephone Encounter (Signed)
I would recomm that he switch to lisinopril 10 mg  ARBs are being recalled right now. Stop losartan   COntinue b blocker   Follow BP

## 2017-10-22 NOTE — Telephone Encounter (Signed)
Xarelto 20mg  refill request received; pt is 62 yrs old, wt-84.3kg, Crea-1.30 on 10/10/17, last seen by Dr. Harrington Challenger on 09/27/17, [place].75ml/min; will send in refill to requested pharmacy.

## 2017-10-23 NOTE — Telephone Encounter (Signed)
There have been a lot of recalls on ARBs including losartan I would recomm stopping and switching to lisinopril 10 mg daily Continue to follow BP

## 2017-10-24 ENCOUNTER — Ambulatory Visit (INDEPENDENT_AMBULATORY_CARE_PROVIDER_SITE_OTHER): Payer: BLUE CROSS/BLUE SHIELD | Admitting: Internal Medicine

## 2017-10-24 ENCOUNTER — Encounter: Payer: Self-pay | Admitting: Internal Medicine

## 2017-10-24 VITALS — BP 150/96 | HR 54 | Ht 71.0 in | Wt 182.8 lb

## 2017-10-24 DIAGNOSIS — I483 Typical atrial flutter: Secondary | ICD-10-CM

## 2017-10-24 DIAGNOSIS — I5022 Chronic systolic (congestive) heart failure: Secondary | ICD-10-CM

## 2017-10-24 DIAGNOSIS — I10 Essential (primary) hypertension: Secondary | ICD-10-CM

## 2017-10-24 DIAGNOSIS — I2510 Atherosclerotic heart disease of native coronary artery without angina pectoris: Secondary | ICD-10-CM

## 2017-10-24 LAB — LIPID PANEL
CHOLESTEROL TOTAL: 233 mg/dL — AB (ref 100–199)
Chol/HDL Ratio: 3.3 ratio (ref 0.0–5.0)
HDL: 71 mg/dL (ref >39)
LDL Calculated: 145 mg/dL — ABNORMAL HIGH (ref 0–99)
TRIGLYCERIDES: 83 mg/dL (ref 0–149)
VLDL Cholesterol Cal: 17 mg/dL (ref 5–40)

## 2017-10-24 MED ORDER — RIVAROXABAN 20 MG PO TABS: 20.0000 mg | ORAL_TABLET | Freq: Every day | ORAL | 3 refills | Status: DC

## 2017-10-24 MED ORDER — LISINOPRIL 10 MG PO TABS: 10.0000 mg | ORAL_TABLET | Freq: Every day | ORAL | 3 refills | Status: DC

## 2017-10-24 NOTE — Patient Instructions (Addendum)
Your physician has recommended you make the following change in your medication:  1. Start lisinopril 10 mg once a day for blood pressure Send weekly message to Dr. Harrington Challenger with home blood pressure readings  Your physician recommends that you return for lab work today (lipids) and on 11/01/17 (BMET, CBC)  See echo appointment date/time change below.

## 2017-10-24 NOTE — Progress Notes (Signed)
Cardiology Office Note   Date:  10/24/2017   ID:  Patrick Ferguson, DOB Aug 01, 1956, MRN [id number]  PCP:  Deland Pretty, MD  Cardiologist:   Dorris Carnes, MD    Pt presents for f/u of atrial flutter      History of Present Illness: Patrick Ferguson is a 62 y.o. male with a history of HTN and atrial flutter.   I saw him in Feb   Was in ER prior   Pt placed on anticoagulation and metoprolol  Unable to control rate.   Set up for TEE cardioversion.  He had this done on 3/7  TEE showed LVEF approximately 30%  Plan was for f/u echo to see if recovered   Since seen he says he feels a little more sluggish  Wuestions if related to metoprolol His kids say his voice is weaker    He denies ddzziness  No palpitations (never had)  Breathing is fair   He emailed in to say BP high   I had thought he was on cozaar and recomm he restart 1/2 dose   He tells me he does not have  Was not taking      Current Meds  Medication Sig  . ibuprofen (ADVIL,MOTRIN) 200 MG tablet Take 400 mg by mouth every 6 (six) hours as needed for headache or moderate pain.  . metoprolol tartrate (LOPRESSOR) 50 MG tablet Take 0.5 tablets (25 mg total) by mouth 2 (two) times daily.  . rivaroxaban (XARELTO) 20 MG TABS tablet Take 1 tablet (20 mg total) by mouth daily with supper.     Allergies:   Patient has no known allergies.   Past Medical History:  Diagnosis Date  . Hypertension     Past Surgical History:  Procedure Laterality Date  . CARDIOVERSION N/A 10/10/2017   Procedure: CARDIOVERSION;  Surgeon: Larey Dresser, MD;  Location: [facility] ENDOSCOPY;  Service: Cardiovascular;  Laterality: N/A;  . TEE WITHOUT CARDIOVERSION N/A 10/10/2017   Procedure: TRANSESOPHAGEAL ECHOCARDIOGRAM (TEE);  Surgeon: Larey Dresser, MD;  Location[facility] ENDOSCOPY;  Service: Cardiovascular;  Laterality: N/A;     Social History:  The patient  reports that he has never smoked. He has never used smokeless tobacco. He reports that he does not drink  alcohol or use drugs.   Family History:  The patient's family history includes Heart disease in his father.    ROS:  Please see the history of present illness. All other systems are reviewed and  Negative to the above problem except as noted.    PHYSICAL EXAM: VS:  BP (!) 150/96   Pulse (!) 54   Ht 5\' 11"  (1.803 m)   Wt 182 lb 12.8 oz (82.9 kg)   SpO2 95%   BMI 25.50 kg/m   GEN: Pt is  in no acute distress  HEENT: normal  Neck: JVP normal  carotid bruits, or masses Cardiac: RRR  Tachy ; no murmurs, rubs, or gallops,no edema  Respiratory:  clear to auscultation bilaterally, normal work of breathing GI: soft, nontender, nondistended, + BS  No hepatomegaly  MS: no deformity Moving all extremities   Skin: warm and dry, no rash Neuro:  Strength and sensation are intact Psych: euthymic mood, full affect   EKG:  EKG is ordered today.  SB 47 bpm   Occasional PVC   Lipid Panel No results found for: CHOL, TRIG, HDL, CHOLHDL, VLDL, LDLCALC, LDLDIRECT    Wt Readings from Last 3 Encounters:  10/24/17 182 lb 12.8  oz (82.9 kg)  10/10/17 182 lb (82.6 kg)  09/27/17 185 lb 12.8 oz (84.3 kg)      ASSESSMENT AND PLAN:  1  Atrial flutter . Pt is s/p cardioversion.  HR in 40s Could try 25 1x per day    COntinue anticoagulation. Will need f/u echo in about 1 month  Will review with EP Probable referral for ablation given that he wnt so fast and had no symptoms and resultant LV dysfunciton.   Also to avoid lifelong anticoagulation.    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  The patient does not have concerns  regarding medicines.  Signed, Dorris Carnes, MD  10/24/2017 10:47 AM    [address], [place], [place]  [address] Phone: [phone]; Fax: [phone]

## 2017-10-31 ENCOUNTER — Encounter: Payer: Self-pay | Admitting: Internal Medicine

## 2017-10-31 ENCOUNTER — Other Ambulatory Visit: Payer: Self-pay | Admitting: *Deleted

## 2017-10-31 MED ORDER — METOPROLOL TARTRATE 50 MG PO TABS: 25.0000 mg | ORAL_TABLET | Freq: Every day | ORAL | Status: DC

## 2017-10-31 NOTE — Progress Notes (Signed)
Called patient w/ lab results. He wanted to confirm he was taking med correctly.  According to last ov note, he was instructed to cut metoprolol to 25 mg once a day and monitor BP and HR.  Pt plans to send weekly readings to Dr. Harrington Challenger via [place].

## 2017-11-01 ENCOUNTER — Other Ambulatory Visit: Payer: BLUE CROSS/BLUE SHIELD | Admitting: *Deleted

## 2017-11-01 ENCOUNTER — Telehealth: Payer: Self-pay | Admitting: *Deleted

## 2017-11-01 ENCOUNTER — Encounter: Payer: Self-pay | Admitting: Internal Medicine

## 2017-11-01 DIAGNOSIS — I483 Typical atrial flutter: Secondary | ICD-10-CM

## 2017-11-01 MED ORDER — METOPROLOL TARTRATE 50 MG PO TABS: 50.0000 mg | ORAL_TABLET | Freq: Two times a day (BID) | ORAL | Status: DC

## 2017-11-01 NOTE — Telephone Encounter (Signed)
Received walk in patient form from front desk. Pt came for labs this morning.  Left a note that he "may be in atrial flutter - HR 126-129.  No SOB, no dizziness.  Called patient. Left message to call back.

## 2017-11-01 NOTE — Telephone Encounter (Signed)
Pt called back. He reviewed that HR last night was 80s-120s.  Current dose of metoprolol is 25 mg every am. 131/100, HR 88 yesterday AM 128/98, HR 127 yesterday afternoon 128/98 HR 82 yesterday 112/80, HR 123 before BED Before went to bed he took 25 mg metoprolol   This am took 25 mg metoprolol BP this AM: 130/89, 129,  (few minutes later) 96/79, 116 124/91 HR 126 before left for office.    Does not feel any symptoms.   Advised pt to check BP/HR again today and if HR >100 and BP >110 take additional 25 mg metoprolol.  He is aware I am forwarding to Dr. Harrington Challenger for further recommendations and will call him back.

## 2017-11-01 NOTE — Telephone Encounter (Signed)
Follow Up:; ° ° °Returning your call. °

## 2017-11-01 NOTE — Telephone Encounter (Signed)
REviewed  Agree with recs.

## 2017-11-01 NOTE — Telephone Encounter (Signed)
I have left msg on pt's VM He probably is back in atrial flutter I spoke to [employer]   His nurse will be calling pt to set up appt for early next week with plans for ablation soon after [place]com:  Stop Lisinopril   Go up on metoprolol as he did before   As BP allows Continue Xarelto.

## 2017-11-01 NOTE — Telephone Encounter (Signed)
-----   Message from [place], Generic sent at 11/01/2017 11:05 AM EDT -----    Hi Dr. Harrington Challenger and Arletha Pili,    I took my blood pressure and heart Rate this morning with the following results:  Friday 3/29/ 7:00am Reading 130/89 HR = 129  Friday 3/29 at 10:00am 119/87 HR 133    Lilliah Priego suggested I take 25MG  of Metoprolol if my 10:00am reading showed BP greater than 100 on the top number and HB greater than 100. Which my 10:00am reading showed higher levels of both. I took the 25mg  of the Metoprolol at 10:30am today.    Please let me know what else you would like for me to do.    Thanks,  [place]

## 2017-11-01 NOTE — Telephone Encounter (Signed)
Talked to patient. Advised to stop lisinopril.  Advised to increase metoprolol to 50 mg BID starting tonight. Advised to monitor BP and HR and starting Saturday night, if BP is >110 and HR is still >100, he should go up to 75 mg BID.  This is what he did previously.  He is aware he will receive a call re: appointment with Dr. Lovena Le.   Advised to call office any time over the weekend if he has concerns about any symptoms at all.

## 2017-11-02 LAB — BASIC METABOLIC PANEL
BUN/Creatinine Ratio: 15 (ref 10–24)
BUN: 20 mg/dL (ref 8–27)
CALCIUM: 9.3 mg/dL (ref 8.6–10.2)
CHLORIDE: 103 mmol/L (ref 96–106)
CO2: 21 mmol/L (ref 20–29)
Creatinine, Ser: 1.35 mg/dL — ABNORMAL HIGH (ref 0.76–1.27)
GFR, EST AFRICAN AMERICAN: 64 mL/min/{1.73_m2} (ref >59)
GFR, EST NON AFRICAN AMERICAN: 55 mL/min/{1.73_m2} — AB (ref >59)
Glucose: 126 mg/dL — ABNORMAL HIGH (ref 65–99)
POTASSIUM: 4.2 mmol/L (ref 3.5–5.2)
Sodium: 139 mmol/L (ref 134–144)

## 2017-11-02 LAB — CBC
HEMATOCRIT: 47.5 % (ref 37.5–51.0)
Hemoglobin: 16.1 g/dL (ref 13.0–17.7)
MCH: 31.7 pg (ref 26.6–33.0)
MCHC: 33.9 g/dL (ref 31.5–35.7)
MCV: 94 fL (ref 79–97)
PLATELETS: 304 10*3/uL (ref 150–379)
RBC: 5.08 x10E6/uL (ref 4.14–5.80)
RDW: 13.2 % (ref 12.3–15.4)
WBC: 7.2 10*3/uL (ref 3.4–10.8)

## 2017-11-03 ENCOUNTER — Encounter: Payer: Self-pay | Admitting: Internal Medicine

## 2017-11-04 ENCOUNTER — Encounter: Payer: Self-pay | Admitting: Internal Medicine

## 2017-11-05 ENCOUNTER — Ambulatory Visit (INDEPENDENT_AMBULATORY_CARE_PROVIDER_SITE_OTHER): Payer: BLUE CROSS/BLUE SHIELD | Admitting: Internal Medicine

## 2017-11-05 ENCOUNTER — Encounter: Payer: Self-pay | Admitting: Internal Medicine

## 2017-11-05 VITALS — BP 120/72 | HR 138 | Ht 71.0 in | Wt 183.8 lb

## 2017-11-05 DIAGNOSIS — I4892 Unspecified atrial flutter: Secondary | ICD-10-CM

## 2017-11-05 NOTE — H&P (View-Only) (Signed)
HPI Mr. Patrick Ferguson is referred today by Patrick Ferguson for evaluation of atrial flutter. 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 He went back to atrial flutter. He does not have palpitations but has noted worsening levels of energy. No chest pain. He has had a gradual uptitration of his beta blocker but still has an increased rate. No syncope. No edema.  No Known Allergies   Current Outpatient Medications  Medication Sig Dispense Refill  . ibuprofen (ADVIL,MOTRIN) 200 MG tablet Take 400 mg by mouth every 6 (six) hours as needed for headache or moderate pain.    . Metoprolol Tartrate 75 MG TABS Take 75 mg by mouth 2 (two) times daily.    . rivaroxaban (XARELTO) 20 MG TABS tablet Take 1 tablet (20 mg total) by mouth daily with supper. 90 tablet 3   No current facility-administered medications for this visit.      Past Medical History:  Diagnosis Date  . Hypertension     ROS:   All systems reviewed and negative except as noted in the HPI.   Past Surgical History:  Procedure Laterality Date  . CARDIOVERSION N/A 10/10/2017   Procedure: CARDIOVERSION;  Surgeon: Larey Dresser, MD;  Location: [facility] ENDOSCOPY;  Service: Cardiovascular;  Laterality: N/A;  . TEE WITHOUT CARDIOVERSION N/A 10/10/2017   Procedure: TRANSESOPHAGEAL ECHOCARDIOGRAM (TEE);  Surgeon: Larey Dresser, MD;  Location: [facility] ENDOSCOPY;  Service: Cardiovascular;  Laterality: N/A;     Family History  Problem Relation Age of Onset  . Heart disease Father      Social History   Socioeconomic History  . Marital status: Married    Spouse name: Not on file  . Number of children: Not on file  . Years of education: Not on file  . Highest education level: Not on file  Occupational History  . Not on file  Social Needs  . Financial resource strain: Not on file  . Food insecurity:   Worry: Not on file    Inability: Not on file  . Transportation needs:    Medical: Not on file    Non-medical: Not on file  Tobacco Use  . Smoking status: Never Smoker  . Smokeless tobacco: Never Used  Substance and Sexual Activity  . Alcohol use: No    Frequency: Never  . Drug use: No  . Sexual activity: Not on file  Lifestyle  . Physical activity:    Days per week: Not on file    Minutes per session: Not on file  . Stress: Not on file  Relationships  . Social connections:    Talks on phone: Not on file    Gets together: Not on file    Attends religious service: Not on file    Active member of club or organization: Not on file    Attends meetings of clubs or organizations: Not on file    Relationship status: Not on file  . Intimate partner violence:    Fear of current or ex partner: Not on file    Emotionally abused: Not on file    Physically abused: Not on file    Forced sexual activity: Not on file  Other Topics Concern  . Not on file  Social History Narrative  . Not on file     BP 120/72  Pulse (!) 138   Ht 5\' 11"  (1.803 m)   Wt 183 lb 12.8 oz (83.4 kg)   BMI 25.63 kg/m   Physical Exam:  Well appearing 62 yo man, NAD HEENT: Unremarkable Neck:  7 cm JVD, no thyromegally Lymphatics:  No adenopathy Back:  No CVA tenderness Lungs:  Clear with rales in the bases HEART:  Mostly regular tachy rhythm, no murmurs, no rubs, no clicks Abd:  soft, positive bowel sounds, no organomegally, no rebound, no guarding Ext:  2 plus pulses, no edema, no cyanosis, no clubbing Skin:  No rashes no nodules Neuro:  CN II through XII intact, motor grossly intact  EKG - atrial flutter with a RVR  Assess/Plan: 1. Atrial flutter - I have discussed the treatment options with the patient. The risks/benefits/goals/expectations of EP study and catheter ablation were reviewed with the patient and he is willing to proceed.  2. Coags - he will continue xarelto for 3-4 weeks after the  ablation.  3. Chronic systolic heart failure - his EF was 30%. He likely has a tachy induced CM. We will hold off on repeating the echo for now but will recheck after he has gone back to NSR. 4. Bleeding - the patient notes that he has had a small amount of bleeding when he strains during a BM. He is instructed to continue xarelto for now.   Mikle Bosworth.D.

## 2017-11-05 NOTE — Patient Instructions (Addendum)
Medication Instructions:  Your physician recommends that you continue on your current medications as directed. Please refer to the Current Medication list given to you today.  Labwork: You will get lab work on November 15, 2017 here at the [facility] office.  You do not need to be fasting.  Testing/Procedures: Your physician has recommended that you have an ablation. Catheter ablation is a medical procedure used to treat some cardiac arrhythmias (irregular heartbeats). During catheter ablation, a long, thin, flexible tube is put into a blood vessel in your groin (upper thigh), or neck. This tube is called an ablation catheter. It is then guided to your heart through the blood vessel. Radio frequency waves destroy small areas of heart tissue where abnormal heartbeats may cause an arrhythmia to start. Please see the instruction sheet given to you today.  Follow-Up:  You will follow up with Dr. Lovena Le 4 weeks after your procedure.  Any Other Special Instructions Will Be Listed Below (If Applicable).  Please arrive at the [facility] main entrance of [place] hospital at:  7:30 am on November 18, 2017 Do not eat or drink after midnight prior to procedure Do not take your metoprolol for 2 days prior to your procedure.  Do not take any morning medications the day of your procedure Plan for one night stay but you may be discharged You will need someone to drive you home at discharge  If you need a refill on your cardiac medications before your next appointment, please call your pharmacy.   Cardiac Ablation Cardiac ablation is a procedure to disable (ablate) a small amount of heart tissue in very specific places. The heart has many electrical connections. Sometimes these connections are abnormal and can cause the heart to beat very fast or irregularly. Ablating some of the problem areas can improve the heart rhythm or return it to normal. Ablation may be done for people who:  Have Wolff-Parkinson-White  syndrome.  Have fast heart rhythms (tachycardia).  Have taken medicines for an abnormal heart rhythm (arrhythmia) that were not effective or caused side effects.  Have a high-risk heartbeat that may be life-threatening.  During the procedure, a small incision is made in the neck or the groin, and a long, thin, flexible tube (catheter) is inserted into the incision and moved to the heart. Small devices (electrodes) on the tip of the catheter will send out electrical currents. A type of X-ray (fluoroscopy) will be used to help guide the catheter and to provide images of the heart. Tell a health care provider about:  Any allergies you have.  All medicines you are taking, including vitamins, herbs, eye drops, creams, and over-the-counter medicines.  Any problems you or family members have had with anesthetic medicines.  Any blood disorders you have.  Any surgeries you have had.  Any medical conditions you have, such as kidney failure.  Whether you are pregnant or may be pregnant. What are the risks? Generally, this is a safe procedure. However, problems may occur, including:  Infection.  Bruising and bleeding at the catheter insertion site.  Bleeding into the chest, especially into the sac that surrounds the heart. This is a serious complication.  Stroke or blood clots.  Damage to other structures or organs.  Allergic reaction to medicines or dyes.  Need for a permanent pacemaker if the normal electrical system is damaged. A pacemaker is a small computer that sends electrical signals to the heart and helps your heart beat normally.  The procedure not  being fully effective. This may not be recognized until months later. Repeat ablation procedures are sometimes required.  What happens before the procedure?  Follow instructions from your health care provider about eating or drinking restrictions.  Ask your health care provider about: ? Changing or stopping your regular  medicines. This is especially important if you are taking diabetes medicines or blood thinners. ? Taking medicines such as aspirin and ibuprofen. These medicines can thin your blood. Do not take these medicines before your procedure if your health care provider instructs you not to.  Plan to have someone take you home from the hospital or clinic.  If you will be going home right after the procedure, plan to have someone with you for 24 hours. What happens during the procedure?  To lower your risk of infection: ? Your health care team will wash or sanitize their hands. ? Your skin will be washed with soap. ? Hair may be removed from the incision area.  An IV tube will be inserted into one of your veins.  You will be given a medicine to help you relax (sedative).  The skin on your neck or groin will be numbed.  An incision will be made in your neck or your groin.  A needle will be inserted through the incision and into a large vein in your neck or groin.  A catheter will be inserted into the needle and moved to your heart.  Dye may be injected through the catheter to help your surgeon see the area of the heart that needs treatment.  Electrical currents will be sent from the catheter to ablate heart tissue in desired areas. There are three types of energy that may be used to ablate heart tissue: ? Heat (radiofrequency energy). ? Laser energy. ? Extreme cold (cryoablation).  When the necessary tissue has been ablated, the catheter will be removed.  Pressure will be held on the catheter insertion area to prevent excessive bleeding.  A bandage (dressing) will be placed over the catheter insertion area. The procedure may vary among health care providers and hospitals. What happens after the procedure?  Your blood pressure, heart rate, breathing rate, and blood oxygen level will be monitored until the medicines you were given have worn off.  Your catheter insertion area will be  monitored for bleeding. You will need to lie still for a few hours to ensure that you do not bleed from the catheter insertion area.  Do not drive for 24 hours or as long as directed by your health care provider. Summary  Cardiac ablation is a procedure to disable (ablate) a small amount of heart tissue in very specific places. Ablating some of the problem areas can improve the heart rhythm or return it to normal.  During the procedure, electrical currents will be sent from the catheter to ablate heart tissue in desired areas. This information is not intended to replace advice given to you by your health care provider. Make sure you discuss any questions you have with your health care provider. Document Released: 12/09/2008 Document Revised: 06/11/2016 Document Reviewed: 06/11/2016 Elsevier Interactive Patient Education  [employer].

## 2017-11-05 NOTE — Progress Notes (Addendum)
HPI Mr. Stockham is referred today by Dr. Harrington Challenger for evaluation of atrial flutter. 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 He went back to atrial flutter. He does not have palpitations but has noted worsening levels of energy. No chest pain. He has had a gradual uptitration of his beta blocker but still has an increased rate. No syncope. No edema.  No Known Allergies   Current Outpatient Medications  Medication Sig Dispense Refill  . ibuprofen (ADVIL,MOTRIN) 200 MG tablet Take 400 mg by mouth every 6 (six) hours as needed for headache or moderate pain.    . Metoprolol Tartrate 75 MG TABS Take 75 mg by mouth 2 (two) times daily.    . rivaroxaban (XARELTO) 20 MG TABS tablet Take 1 tablet (20 mg total) by mouth daily with supper. 90 tablet 3   No current facility-administered medications for this visit.      Past Medical History:  Diagnosis Date  . Hypertension     ROS:   All systems reviewed and negative except as noted in the HPI.   Past Surgical History:  Procedure Laterality Date  . CARDIOVERSION N/A 10/10/2017   Procedure: CARDIOVERSION;  Surgeon: Larey Dresser, MD;  Location: [facility] ENDOSCOPY;  Service: Cardiovascular;  Laterality: N/A;  . TEE WITHOUT CARDIOVERSION N/A 10/10/2017   Procedure: TRANSESOPHAGEAL ECHOCARDIOGRAM (TEE);  Surgeon: Larey Dresser, MD;  Location: [facility] ENDOSCOPY;  Service: Cardiovascular;  Laterality: N/A;     Family History  Problem Relation Age of Onset  . Heart disease Father      Social History   Socioeconomic History  . Marital status: Married    Spouse name: Not on file  . Number of children: Not on file  . Years of education: Not on file  . Highest education level: Not on file  Occupational History  . Not on file  Social Needs  . Financial resource strain: Not on file  . Food insecurity:   Worry: Not on file    Inability: Not on file  . Transportation needs:    Medical: Not on file    Non-medical: Not on file  Tobacco Use  . Smoking status: Never Smoker  . Smokeless tobacco: Never Used  Substance and Sexual Activity  . Alcohol use: No    Frequency: Never  . Drug use: No  . Sexual activity: Not on file  Lifestyle  . Physical activity:    Days per week: Not on file    Minutes per session: Not on file  . Stress: Not on file  Relationships  . Social connections:    Talks on phone: Not on file    Gets together: Not on file    Attends religious service: Not on file    Active member of club or organization: Not on file    Attends meetings of clubs or organizations: Not on file    Relationship status: Not on file  . Intimate partner violence:    Fear of current or ex partner: Not on file    Emotionally abused: Not on file    Physically abused: Not on file    Forced sexual activity: Not on file  Other Topics Concern  . Not on file  Social History Narrative  . Not on file     BP 120/72  Pulse (!) 138   Ht 5\' 11"  (1.803 m)   Wt 183 lb 12.8 oz (83.4 kg)   BMI 25.63 kg/m   Physical Exam:  Well appearing 62 yo man, NAD HEENT: Unremarkable Neck:  7 cm JVD, no thyromegally Lymphatics:  No adenopathy Back:  No CVA tenderness Lungs:  Clear with rales in the bases HEART:  Mostly regular tachy rhythm, no murmurs, no rubs, no clicks Abd:  soft, positive bowel sounds, no organomegally, no rebound, no guarding Ext:  2 plus pulses, no edema, no cyanosis, no clubbing Skin:  No rashes no nodules Neuro:  CN II through XII intact, motor grossly intact  EKG - atrial flutter with a RVR  Assess/Plan: 1. Atrial flutter - I have discussed the treatment options with the patient. The risks/benefits/goals/expectations of EP study and catheter ablation were reviewed with the patient and he is willing to proceed.  2. Coags - he will continue xarelto for 3-4 weeks after the  ablation.  3. Chronic systolic heart failure - his EF was 30%. He likely has a tachy induced CM. We will hold off on repeating the echo for now but will recheck after he has gone back to NSR. 4. Bleeding - the patient notes that he has had a small amount of bleeding when he strains during a BM. He is instructed to continue xarelto for now.   Mikle Bosworth.D.

## 2017-11-07 ENCOUNTER — Encounter: Payer: Self-pay | Admitting: Internal Medicine

## 2017-11-08 ENCOUNTER — Telehealth: Payer: Self-pay | Admitting: Internal Medicine

## 2017-11-08 ENCOUNTER — Other Ambulatory Visit (HOSPITAL_COMMUNITY): Payer: BLUE CROSS/BLUE SHIELD

## 2017-11-08 NOTE — Telephone Encounter (Signed)
Returned call to Pt.  Per Pt his heart rate has consistently been in the 130's.  However Pt has been asymptomatic with this rate.  He is worried about his last TEE which showed an EF of 30%.  He is concerned that his heart continuing to beat this fast will cause long term damage.  Pt asking me to ask Dr. Lovena Le. Spoke with Dr. Lovena Le.  Per Dr. Lovena Le, notify Pt that waiting one more week for his ablation will not cause long term heart damage.   Notified Pt per MyChart as agreed upon with Pt.  No further action needed.

## 2017-11-08 NOTE — Telephone Encounter (Signed)
New Message: ° ° ° ° ° ° °Pt is returning a call °

## 2017-11-11 ENCOUNTER — Encounter: Payer: Self-pay | Admitting: Internal Medicine

## 2017-11-14 ENCOUNTER — Telehealth: Payer: Self-pay | Admitting: Internal Medicine

## 2017-11-14 ENCOUNTER — Encounter: Payer: Self-pay | Admitting: Internal Medicine

## 2017-11-14 NOTE — Telephone Encounter (Signed)
Returned call to Pt.  Reiterated previously given medication instruction. No further action needed.

## 2017-11-14 NOTE — Telephone Encounter (Signed)
New message     Patient calling with questions about procedure 11/18/2017. Patient wants to discuss blood thinner.

## 2017-11-15 ENCOUNTER — Other Ambulatory Visit (HOSPITAL_COMMUNITY): Payer: BLUE CROSS/BLUE SHIELD

## 2017-11-15 ENCOUNTER — Other Ambulatory Visit: Payer: BLUE CROSS/BLUE SHIELD

## 2017-11-15 DIAGNOSIS — I4892 Unspecified atrial flutter: Secondary | ICD-10-CM

## 2017-11-15 LAB — BASIC METABOLIC PANEL
BUN/Creatinine Ratio: 13 (ref 10–24)
BUN: 16 mg/dL (ref 8–27)
CALCIUM: 9 mg/dL (ref 8.6–10.2)
CHLORIDE: 103 mmol/L (ref 96–106)
CO2: 23 mmol/L (ref 20–29)
Creatinine, Ser: 1.21 mg/dL (ref 0.76–1.27)
GFR calc Af Amer: 73 mL/min/{1.73_m2} (ref >59)
GFR, EST NON AFRICAN AMERICAN: 63 mL/min/{1.73_m2} (ref >59)
Glucose: 78 mg/dL (ref 65–99)
Potassium: 4.3 mmol/L (ref 3.5–5.2)
Sodium: 140 mmol/L (ref 134–144)

## 2017-11-15 LAB — CBC WITH DIFFERENTIAL/PLATELET
Basophils Absolute: 0.1 10*3/uL (ref 0.0–0.2)
Basos: 1 %
EOS (ABSOLUTE): 0.1 10*3/uL (ref 0.0–0.4)
EOS: 2 %
HEMATOCRIT: 43.6 % (ref 37.5–51.0)
HEMOGLOBIN: 14.7 g/dL (ref 13.0–17.7)
IMMATURE GRANS (ABS): 0 10*3/uL (ref 0.0–0.1)
IMMATURE GRANULOCYTES: 0 %
LYMPHS: 31 %
Lymphocytes Absolute: 1.9 10*3/uL (ref 0.7–3.1)
MCH: 31.6 pg (ref 26.6–33.0)
MCHC: 33.7 g/dL (ref 31.5–35.7)
MCV: 94 fL (ref 79–97)
MONOCYTES: 10 %
Monocytes Absolute: 0.6 10*3/uL (ref 0.1–0.9)
NEUTROS PCT: 56 %
Neutrophils Absolute: 3.3 10*3/uL (ref 1.4–7.0)
Platelets: 263 10*3/uL (ref 150–379)
RBC: 4.65 x10E6/uL (ref 4.14–5.80)
RDW: 13.6 % (ref 12.3–15.4)
WBC: 5.9 10*3/uL (ref 3.4–10.8)

## 2017-11-18 ENCOUNTER — Ambulatory Visit (HOSPITAL_COMMUNITY)
Admission: RE | Admit: 2017-11-18 | Discharge: 2017-11-18 | Disposition: A | Discharge status: Home / Self Care | Payer: BLUE CROSS/BLUE SHIELD | Source: Ambulatory Visit | Attending: Internal Medicine | Admitting: Internal Medicine

## 2017-11-18 ENCOUNTER — Encounter (HOSPITAL_COMMUNITY)
Admission: RE | Disposition: A | Discharge status: Home / Self Care | Payer: Self-pay | Source: Ambulatory Visit | Attending: Internal Medicine

## 2017-11-18 DIAGNOSIS — I483 Typical atrial flutter: Secondary | ICD-10-CM | POA: Diagnosis present

## 2017-11-18 DIAGNOSIS — I5022 Chronic systolic (congestive) heart failure: Secondary | ICD-10-CM | POA: Insufficient documentation

## 2017-11-18 DIAGNOSIS — I110 Hypertensive heart disease with heart failure: Secondary | ICD-10-CM | POA: Diagnosis not present

## 2017-11-18 DIAGNOSIS — Z7901 Long term (current) use of anticoagulants: Secondary | ICD-10-CM | POA: Insufficient documentation

## 2017-11-18 HISTORY — PX: A-FLUTTER ABLATION: EP1230

## 2017-11-18 SURGERY — A-FLUTTER ABLATION

## 2017-11-18 MED ORDER — MIDAZOLAM HCL 5 MG/5ML IJ SOLN
INTRAMUSCULAR | Status: AC
  Filled 2017-11-18: qty 5

## 2017-11-18 MED ORDER — FENTANYL CITRATE (PF) 100 MCG/2ML IJ SOLN
INTRAMUSCULAR | Status: AC
  Filled 2017-11-18: qty 2

## 2017-11-18 MED ORDER — ONDANSETRON HCL 4 MG/2ML IJ SOLN: 4.0000 mg | Freq: Four times a day (QID) | INTRAMUSCULAR | Status: DC | PRN

## 2017-11-18 MED ORDER — FENTANYL CITRATE (PF) 100 MCG/2ML IJ SOLN
INTRAMUSCULAR | Status: DC | PRN
  Administered 2017-11-18 (×5): 25 ug via INTRAVENOUS
  Administered 2017-11-18: 12.5 ug via INTRAVENOUS
  Administered 2017-11-18: 25 ug via INTRAVENOUS

## 2017-11-18 MED ORDER — SODIUM CHLORIDE 0.9 % IV SOLN
INTRAVENOUS | Status: DC
  Administered 2017-11-18: 08:00:00 via INTRAVENOUS

## 2017-11-18 MED ORDER — SODIUM CHLORIDE 0.9% FLUSH: 3.0000 mL | Freq: Two times a day (BID) | INTRAVENOUS | Status: DC

## 2017-11-18 MED ORDER — FENTANYL CITRATE (PF) 100 MCG/2ML IJ SOLN
INTRAMUSCULAR | Status: AC
Start: 2017-11-18 — End: ?
  Filled 2017-11-18: qty 2

## 2017-11-18 MED ORDER — METOPROLOL TARTRATE 50 MG PO TABS: 50.0000 mg | ORAL_TABLET | Freq: Two times a day (BID) | ORAL | 6 refills | Status: DC

## 2017-11-18 MED ORDER — HEPARIN (PORCINE) IN NACL 2-0.9 UNIT/ML-% IJ SOLN
INTRAMUSCULAR | Status: AC
  Filled 2017-11-18: qty 500

## 2017-11-18 MED ORDER — SODIUM CHLORIDE 0.9% FLUSH: 3.0000 mL | INTRAVENOUS | Status: DC | PRN

## 2017-11-18 MED ORDER — BUPIVACAINE HCL (PF) 0.25 % IJ SOLN
INTRAMUSCULAR | Status: AC
  Filled 2017-11-18: qty 30

## 2017-11-18 MED ORDER — ACETAMINOPHEN 325 MG PO TABS: 650.0000 mg | ORAL_TABLET | ORAL | Status: DC | PRN

## 2017-11-18 MED ORDER — SODIUM CHLORIDE 0.9 % IV SOLN: 250.0000 mL | INTRAVENOUS | Status: DC | PRN

## 2017-11-18 MED ORDER — HEPARIN (PORCINE) IN NACL 2-0.9 UNIT/ML-% IJ SOLN
INTRAMUSCULAR | Status: AC | PRN
  Administered 2017-11-18: 500 mL

## 2017-11-18 MED ORDER — BUPIVACAINE HCL (PF) 0.25 % IJ SOLN
INTRAMUSCULAR | Status: DC | PRN
  Administered 2017-11-18: 45 mL

## 2017-11-18 MED ORDER — MIDAZOLAM HCL 5 MG/5ML IJ SOLN
INTRAMUSCULAR | Status: DC | PRN
  Administered 2017-11-18 (×6): 2 mg via INTRAVENOUS
  Administered 2017-11-18: 1 mg via INTRAVENOUS

## 2017-11-18 SURGICAL SUPPLY — 11 items
BAG SNAP BAND KOVER 36X36 (MISCELLANEOUS) ×2 IMPLANT
CATH EZ STEER NAV 8MM F-J CUR (ABLATOR) ×2 IMPLANT
CATH JOSEPHSON QUAD-ALLRED 6FR (CATHETERS) ×2 IMPLANT
CATH WEBSTER BI DIR CS D-F CRV (CATHETERS) ×2 IMPLANT
PACK EP LATEX FREE (CUSTOM PROCEDURE TRAY) ×1
PACK EP LF (CUSTOM PROCEDURE TRAY) ×1 IMPLANT
PAD DEFIB LIFELINK (PAD) ×2 IMPLANT
SHEATH AVANTI 11CM 6FR (SHEATH) ×2 IMPLANT
SHEATH AVANTI 11CM 7FR (SHEATH) ×2 IMPLANT
SHEATH AVANTI 11CM 8FR (SHEATH) ×2 IMPLANT
SHIELD RADPAD SCOOP 12X17 (MISCELLANEOUS) ×2 IMPLANT

## 2017-11-18 NOTE — Interval H&P Note (Signed)
History and Physical Interval Note:  11/18/2017 9:40 AM  Patrick Ferguson  has presented today for surgery, with the diagnosis of aflutter  The various methods of treatment have been discussed with the patient and family. After consideration of risks, benefits and other options for treatment, the patient has consented to  Procedure(s): A-FLUTTER ABLATION (N/A) as a surgical intervention .  The patient's history has been reviewed, patient examined, no change in status, stable for surgery.  I have reviewed the patient's chart and labs.  Questions were answered to the patient's satisfaction.     Patrick Ferguson

## 2017-11-18 NOTE — Progress Notes (Signed)
Small amt oozing noted right groin and dressing changed and client up and walked and tolerated well; right groin stable, no bleeding or hematoma

## 2017-11-18 NOTE — Discharge Instructions (Signed)
Femoral Site Care Refer to this sheet in the next few weeks. These instructions provide you with information about caring for yourself after your procedure. Your health care provider may also give you more specific instructions. Your treatment has been planned according to current medical practices, but problems sometimes occur. Call your health care provider if you have any problems or questions after your procedure. What can I expect after the procedure? After your procedure, it is typical to have the following:  Bruising at the site that usually fades within 1-2 weeks.  Blood collecting in the tissue (hematoma) that may be painful to the touch. It should usually decrease in size and tenderness within 1-2 weeks.  Follow these instructions at home:  Take medicines only as directed by your health care provider.  You may shower 24-48 hours after the procedure or as directed by your health care provider. Remove the bandage (dressing) and gently wash the site with plain soap and water. Pat the area dry with a clean towel. Do not rub the site, because this may cause bleeding.  Do not take baths, swim, or use a hot tub until your health care provider approves.  Check your insertion site every day for redness, swelling, or drainage.  Do not apply powder or lotion to the site.  Limit use of stairs to twice a day for the first 2-3 days or as directed by your health care provider.  Do not squat for the first 2-3 days or as directed by your health care provider.  Do not lift over 10 lb (4.5 kg) for 5 days after your procedure or as directed by your health care provider.  Ask your health care provider when it is okay to: ? Return to work or school. ? Resume usual physical activities or sports. ? Resume sexual activity.  Do not drive home if you are discharged the same day as the procedure. Have someone else drive you.  You may drive 24 hours after the procedure unless otherwise instructed by  your health care provider.  Do not operate machinery or power tools for 24 hours after the procedure or as directed by your health care provider.  If your procedure was done as an outpatient procedure, which means that you went home the same day as your procedure, a responsible adult should be with you for the first 24 hours after you arrive home.  Keep all follow-up visits as directed by your health care provider. This is important. Contact a health care provider if:  You have a fever.  You have chills.  You have increased bleeding from the site. Hold pressure on the site. Get help right away if:  You have unusual pain at the site.  You have redness, warmth, or swelling at the site.  You have drainage (other than a small amount of blood on the dressing) from the site.  The site is bleeding, and the bleeding does not stop after 30 minutes of holding steady pressure on the site.  Your leg or foot becomes pale, cool, tingly, or numb. This information is not intended to replace advice given to you by your health care provider. Make sure you discuss any questions you have with your health care provider. Document Released: 03/26/2014 Document Revised: 12/29/2015 Document Reviewed: 02/09/2014 Elsevier Interactive Patient Education  2018 [place] procedure care instructions No driving for 4 days. No lifting over 5 lbs for 1 week. No vigorous or sexual activity for 1  week. You may return to work on 11/25/17. Keep procedure site clean & dry. If you notice increased pain, swelling, bleeding or pus, call/return!  You may shower, but no soaking baths/hot tubs/pools for 1 week.

## 2017-11-18 NOTE — Progress Notes (Signed)
Report from Debbie, RN

## 2017-11-19 ENCOUNTER — Encounter (HOSPITAL_COMMUNITY): Payer: Self-pay | Admitting: Internal Medicine

## 2017-11-29 ENCOUNTER — Ambulatory Visit (HOSPITAL_COMMUNITY): Payer: BLUE CROSS/BLUE SHIELD | Attending: Cardiovascular Disease

## 2017-11-29 ENCOUNTER — Other Ambulatory Visit: Payer: Self-pay

## 2017-11-29 DIAGNOSIS — I4891 Unspecified atrial fibrillation: Secondary | ICD-10-CM | POA: Insufficient documentation

## 2017-11-29 DIAGNOSIS — I10 Essential (primary) hypertension: Secondary | ICD-10-CM | POA: Insufficient documentation

## 2017-11-29 DIAGNOSIS — I081 Rheumatic disorders of both mitral and tricuspid valves: Secondary | ICD-10-CM | POA: Insufficient documentation

## 2017-12-03 ENCOUNTER — Telehealth: Payer: Self-pay | Admitting: Internal Medicine

## 2017-12-03 ENCOUNTER — Encounter: Payer: Self-pay | Admitting: Internal Medicine

## 2017-12-03 NOTE — Telephone Encounter (Signed)
Called pt to review echo   LVEF has improved!  Pt informed me that his wife  is dying    Hospice at their home ([facility])  Wanted to review BPs  BP levels at home 145/86  HR 56   135/85  HR 64 142/83  HR 54    156/88  HR 49     Pt wants to get seen Told him I would place on sched end of next week to revuew and adjust  In interim if dizzy can back down on metoprolol (on 75 bid) to 1/2 tab bid    Reviewed with Ronnie Derby   Call pt next week.

## 2017-12-05 NOTE — Telephone Encounter (Signed)
Scheduled patient for 12/13/17 at 12:00 pm.

## 2017-12-12 ENCOUNTER — Encounter (INDEPENDENT_AMBULATORY_CARE_PROVIDER_SITE_OTHER): Payer: Self-pay

## 2017-12-13 ENCOUNTER — Encounter: Payer: Self-pay | Admitting: Internal Medicine

## 2017-12-13 ENCOUNTER — Ambulatory Visit (INDEPENDENT_AMBULATORY_CARE_PROVIDER_SITE_OTHER): Payer: BLUE CROSS/BLUE SHIELD | Admitting: Internal Medicine

## 2017-12-13 VITALS — BP 140/90 | HR 50 | Ht 71.0 in | Wt 180.8 lb

## 2017-12-13 DIAGNOSIS — I2510 Atherosclerotic heart disease of native coronary artery without angina pectoris: Secondary | ICD-10-CM | POA: Diagnosis not present

## 2017-12-13 DIAGNOSIS — E782 Mixed hyperlipidemia: Secondary | ICD-10-CM | POA: Diagnosis not present

## 2017-12-13 DIAGNOSIS — I4892 Unspecified atrial flutter: Secondary | ICD-10-CM | POA: Diagnosis not present

## 2017-12-13 MED ORDER — ROSUVASTATIN CALCIUM 5 MG PO TABS: 5.0000 mg | ORAL_TABLET | Freq: Every day | ORAL | 3 refills | Status: DC

## 2017-12-13 NOTE — Patient Instructions (Signed)
Your physician has recommended you make the following change in your medication:  1.) rosuvastatin 5 mg (Crestor) one tablet once a day  Your physician recommends that you return for lab work in:  2 months (lipids, ast)  Your physician recommends that you schedule a follow-up appointment in: 4 months with Dr. Harrington Challenger.

## 2017-12-13 NOTE — Progress Notes (Signed)
Cardiology Office Note   Date:  12/13/2017   ID:  Patrick Ferguson, DOB 07-Aug-1955, MRN [id number]  PCP:  Deland Pretty, MD  Cardiologist:   Dorris Carnes, MD    Pt presents for f/u of atrial flutter      History of Present Illness: Patrick Ferguson is a 62 y.o. male with a history of HTN and atrial flutter.   I saw him in Feb   Was in ER prior   Pt placed on anticoagulation and metoprolol  Unable to control rate.   Set up for TEE cardioversion.  He had this done on 3/7  TEE showed LVEF approximately 30%  Plan was for f/u echo to see if recovered   I saw him back after cardiversion  He was in [place] sluggish    Set up to Beckie Salts to discuss ablation. He went back into atrial flutter after this visit   Seen by Beckie Salts and underwent ablation on 11/18/17   Repeat echo done on 4/26 showed LVEF was low normal at 50 to 55% Atrial wer noted to by mildly dilated.      Since ablation he denies SOB   No dizziness   No CP   No palpitations Has been under increased stress  Wife died after long battle with Altzheimers dz.                                                          Current Meds  Medication Sig  . acetaminophen (TYLENOL) 500 MG tablet Take 500 mg by mouth every 6 (six) hours as needed for moderate pain or headache.  . metoprolol tartrate (LOPRESSOR) 50 MG tablet Take 1 tablet (50 mg total) by mouth 2 (two) times daily.  . rivaroxaban (XARELTO) 20 MG TABS tablet Take 1 tablet (20 mg total) by mouth daily with supper.     Allergies:   Sulfites   Past Medical History:  Diagnosis Date  . Hypertension     Past Surgical History:  Procedure Laterality Date  . A-FLUTTER ABLATION N/A 11/18/2017   Procedure: A-FLUTTER ABLATION;  Surgeon: Evans Lance, MD;  Location: [place] CV LAB;  Service: Cardiovascular;  Laterality: N/A;  . CARDIOVERSION N/A 10/10/2017   Procedure: CARDIOVERSION;  Surgeon: Larey Dresser, MD;  Location: [facility] ENDOSCOPY;  Service: Cardiovascular;  Laterality:  N/A;  . TEE WITHOUT CARDIOVERSION N/A 10/10/2017   Procedure: TRANSESOPHAGEAL ECHOCARDIOGRAM (TEE);  Surgeon: Larey Dresser, MD;  Location: [facility] ENDOSCOPY;  Service: Cardiovascular;  Laterality: N/A;     Social History:  The patient  reports that he has never smoked. He has never used smokeless tobacco. He reports that he does not drink alcohol or use drugs.   Family History:  The patient's family history includes Heart disease in his father.    ROS:  Please see the history of present illness. All other systems are reviewed and  Negative to the above problem except as noted.    PHYSICAL EXAM: VS:  BP 140/90   Pulse (!) 50   Ht 5\' 11"  (1.803 m)   Wt 180 lb 12.8 oz (82 kg)   SpO2 97%   BMI 25.22 kg/m   GEN: Pt is  in no acute distress  HEENT: normal  Neck: JVP normal  carotid  bruits, or masses Cardiac: RRR ; no murmurs, rubs, or gallops,no edema  Respiratory:  clear to auscultation bilaterally, normal work of breathing GI: soft, nontender, nondistended, + BS  No hepatomegaly  MS: no deformity Moving all extremities   Skin: warm and dry, no rash Neuro:  Strength and sensation are intact Psych: euthymic mood, full affect   EKG:  EKG is not ordered today     Lipid Panel    Component Value Date/Time   CHOL 233 (H) 10/24/2017 1131   TRIG 83 10/24/2017 1131   HDL 71 10/24/2017 1131   CHOLHDL 3.3 10/24/2017 1131   LDLCALC 145 (H) 10/24/2017 1131      Wt Readings from Last 3 Encounters:  12/13/17 180 lb 12.8 oz (82 kg)  11/18/17 180 lb (81.6 kg)  11/05/17 183 lb 12.8 oz (83.4 kg)      ASSESSMENT AND PLAN:  1  Atrial flutter .Pt failed cardioversion   No s/p ablation  Clinically remains in SR    Echo shows LVEF low normal   Mild  LAE, RAE Remains on anticoag   Would like to get to 1 month post   Then consider stopping if still in SR 2 Chronic systolic CHF LVEF is low normal    3 Coronary calcifications    Mild plaquing  I would recomm getting cholesterol down to  prevent progression  WOuld try Cretor 5 mg   F/U in 2 months  4  Lipids   LDL in 120s   SHould be lower with plaquing on CT scan    5  HTN  BP is up   Would add lisinopril  Check BMET in 10 days    Pt will send in readings     Current medicines are reviewed at length with the patient today.  The patient does not have concerns regarding medicines.  Signed, Dorris Carnes, MD  12/13/2017 12:19 PM    [address], [place], [place]  [address] Phone: [phone]; Fax: [phone]

## 2017-12-18 ENCOUNTER — Encounter: Payer: Self-pay | Admitting: Internal Medicine

## 2017-12-23 ENCOUNTER — Ambulatory Visit (INDEPENDENT_AMBULATORY_CARE_PROVIDER_SITE_OTHER): Payer: BLUE CROSS/BLUE SHIELD | Admitting: Internal Medicine

## 2017-12-23 ENCOUNTER — Encounter: Payer: Self-pay | Admitting: Internal Medicine

## 2017-12-23 VITALS — BP 134/88 | HR 59 | Ht 71.5 in | Wt 182.0 lb

## 2017-12-23 DIAGNOSIS — I4892 Unspecified atrial flutter: Secondary | ICD-10-CM

## 2017-12-23 MED ORDER — METOPROLOL TARTRATE 50 MG PO TABS: ORAL_TABLET | ORAL | 3 refills | Status: DC

## 2017-12-23 NOTE — Progress Notes (Signed)
HPI Mr. Popelka returns today after undergoing EP study and ablation of his atrial flutter. He feels well and has had a repeat echo demonstrating normalization of his LV function. He denies chest pain or sob. He would like to get off of his blood thinner. No palpitations and no syncope.  Allergies  Allergen Reactions  . Sulfites Other (See Comments)    Uvula gets extended     Current Outpatient Medications  Medication Sig Dispense Refill  . acetaminophen (TYLENOL) 500 MG tablet Take 500 mg by mouth every 6 (six) hours as needed for moderate pain or headache.    . rosuvastatin (CRESTOR) 5 MG tablet Take 1 tablet (5 mg total) by mouth daily. 90 tablet 3  . metoprolol tartrate (LOPRESSOR) 50 MG tablet Reduce to 1/2 tab bid x 2 weeks.  Then reduce to 1/2 tablet daily.  Then discontinue. 180 tablet 3   No current facility-administered medications for this visit.      Past Medical History:  Diagnosis Date  . Hypertension     ROS:   All systems reviewed and negative except as noted in the HPI.   Past Surgical History:  Procedure Laterality Date  . A-FLUTTER ABLATION N/A 11/18/2017   Procedure: A-FLUTTER ABLATION;  Surgeon: Evans Lance, MD;  Location: [facility];  Service: Cardiovascular;  Laterality: N/A;  . CARDIOVERSION N/A 10/10/2017   Procedure: CARDIOVERSION;  Surgeon: Larey Dresser, MD;  Location: [facility] ENDOSCOPY;  Service: Cardiovascular;  Laterality: N/A;  . TEE WITHOUT CARDIOVERSION N/A 10/10/2017   Procedure: TRANSESOPHAGEAL ECHOCARDIOGRAM (TEE);  Surgeon: Larey Dresser, MD;  Location: [facility] ENDOSCOPY;  Service: Cardiovascular;  Laterality: N/A;     Family History  Problem Relation Age of Onset  . Heart disease Father      Social History   Socioeconomic History  . Marital status: Married    Spouse name: Not on file  . Number of children: Not on file  . Years of education: Not on file  . Highest education level: Not on file  Occupational History   . Not on file  Social Needs  . Financial resource strain: Not on file  . Food insecurity:    Worry: Not on file    Inability: Not on file  . Transportation needs:    Medical: Not on file    Non-medical: Not on file  Tobacco Use  . Smoking status: Never Smoker  . Smokeless tobacco: Never Used  Substance and Sexual Activity  . Alcohol use: No    Frequency: Never  . Drug use: No  . Sexual activity: Not on file  Lifestyle  . Physical activity:    Days per week: Not on file    Minutes per session: Not on file  . Stress: Not on file  Relationships  . Social connections:    Talks on phone: Not on file    Gets together: Not on file    Attends religious service: Not on file    Active member of club or organization: Not on file    Attends meetings of clubs or organizations: Not on file    Relationship status: Not on file  . Intimate partner violence:    Fear of current or ex partner: Not on file    Emotionally abused: Not on file    Physically abused: Not on file    Forced sexual activity: Not on file  Other Topics Concern  . Not on file  Social  History Narrative  . Not on file     BP 134/88   Pulse (!) 59   Ht 5' 11.5" (1.816 m)   Wt 182 lb (82.6 kg)   SpO2 97%   BMI 25.03 kg/m   Physical Exam:  Well appearing 62 yo man, NAD HEENT: Unremarkable Neck:  6 cm JVD, no thyromegally Lymphatics:  No adenopathy Back:  No CVA tenderness Lungs:  Clear with no wheezes HEART:  Regular rate rhythm, no murmurs, no rubs, no clicks Abd:  soft, positive bowel sounds, no organomegally, no rebound, no guarding Ext:  2 plus pulses, no edema, no cyanosis, no clubbing Skin:  No rashes no nodules Neuro:  CN II through XII intact, motor grossly intact  EKG - nsr  Assess/Plan: 1. Atrial flutter - he is maintaining NSR after EP study and ablation. He will stop his anti-coagulation today. 2. HTN - we discussed his blood pressure meds. He was placed on metoprolol for rate control. He  would like to try diet and exercise. I have asked that he check his blood pressure at least once a week and log it and wean off of metoprolol. I suspect he will need something for his blood pressure. If his systolic goes over 056 or diastolic over 979, he is instructed to call before seeing Dr. Harrington Challenger. 3. Chronic systolic heart failure - his ef has normalized. He will continue to maintain a low sodium diet.  Mikle Bosworth.D.

## 2017-12-23 NOTE — Patient Instructions (Addendum)
Medication Instructions:  Your physician has recommended you make the following change in your medication:  1.  Stop taking Xarelto 2.  Start weaning yourself of metoprolol-  A.  Reduce to 1/2 tablet by mouth twice a day for 2 weeks  B.  Then reduce to 1/2 tablet once daily for 2 weeks.  C.  Then discontinue  Take your blood pressure weekly.  If your systolic (top number) is over 150 or if your diastolic (bottom number) is over 100 call Dr. Harrington Challenger.  Labwork: None ordered.  Testing/Procedures: None ordered.  Follow-Up: Your physician wants you to follow-up in: as needed with Dr. Lovena Le.     Any Other Special Instructions Will Be Listed Below (If Applicable).  If you need a refill on your cardiac medications before your next appointment, please call your pharmacy.

## 2018-02-12 ENCOUNTER — Other Ambulatory Visit: Payer: BLUE CROSS/BLUE SHIELD

## 2018-02-20 ENCOUNTER — Other Ambulatory Visit: Payer: BLUE CROSS/BLUE SHIELD | Admitting: *Deleted

## 2018-02-20 DIAGNOSIS — I2510 Atherosclerotic heart disease of native coronary artery without angina pectoris: Secondary | ICD-10-CM

## 2018-02-20 DIAGNOSIS — E782 Mixed hyperlipidemia: Secondary | ICD-10-CM

## 2018-02-20 LAB — LIPID PANEL
CHOL/HDL RATIO: 2 ratio (ref 0.0–5.0)
Cholesterol, Total: 163 mg/dL (ref 100–199)
HDL: 80 mg/dL (ref >39)
LDL Calculated: 72 mg/dL (ref 0–99)
Triglycerides: 55 mg/dL (ref 0–149)
VLDL CHOLESTEROL CAL: 11 mg/dL (ref 5–40)

## 2018-02-20 LAB — AST: AST: 17 IU/L (ref 0–40)

## 2018-04-04 IMAGING — CT CT CHEST W/ CM
2 of 3 series · 15 of 36 positions shown, 18 images · IV contrast (iopamidol)
Comparison: Chest radiographs at 8585 hr today, with no earlier
studies for comparison.

CLINICAL DATA: 62-year-old male with cough and fatigue for several
weeks. Possible right paratracheal abnormality on chest radiographs
today.

EXAM:
CT CHEST WITH CONTRAST
TECHNIQUE: Multidetector CT imaging of the chest was performed during
intravenous contrast administration.
CONTRAST:  75 milliliters 257CTP-3QQ IOPAMIDOL (257CTP-3QQ)
INJECTION 61%

[Series 2: axial st · axial · 0.82mm/px · z∈[+820,+1152]mm · 12 of 196 slices shown, 15 images]
[im 15/196  mediastinal]
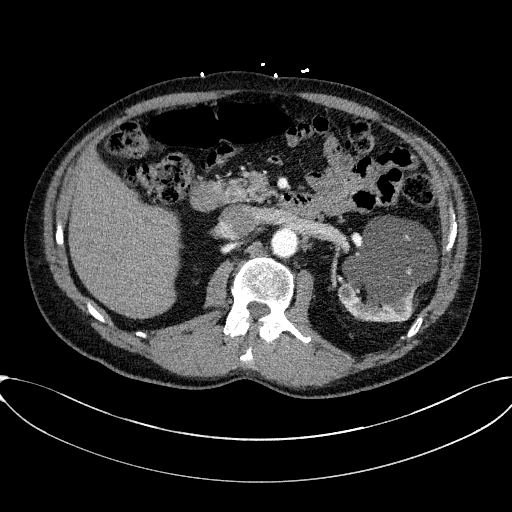
[im 15/196  lung]
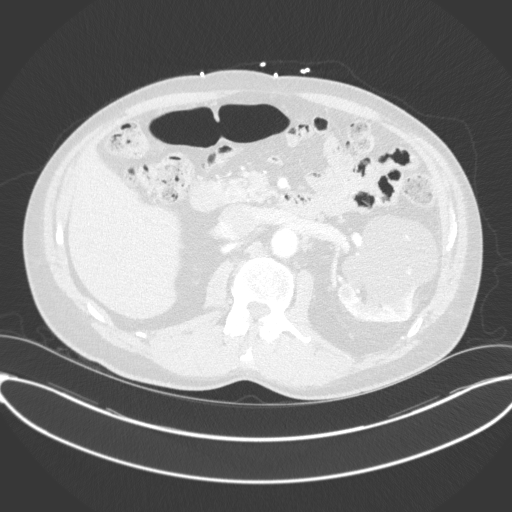
[im 29/196  lung]
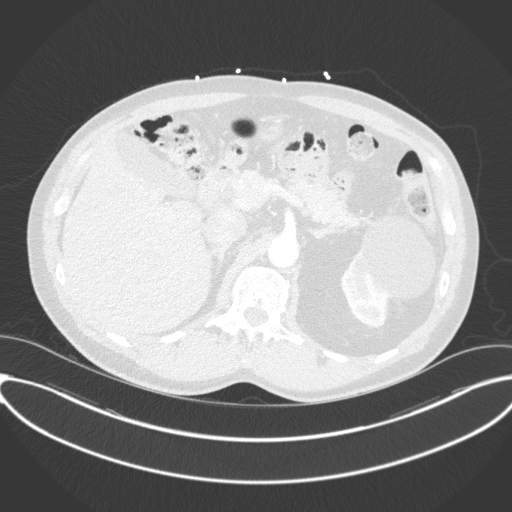
[im 44/196  lung]
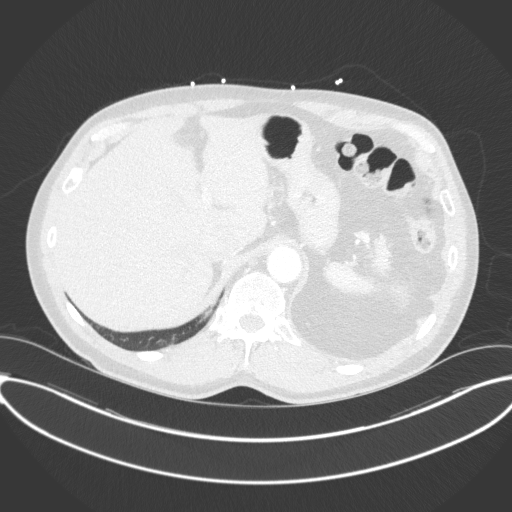
[im 58/196  lung]
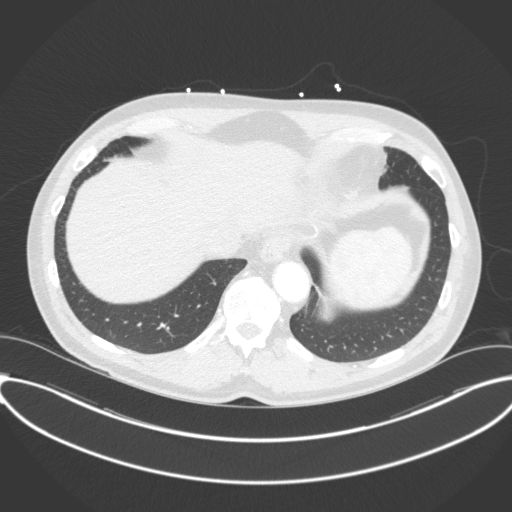
[im 73/196  mediastinal]
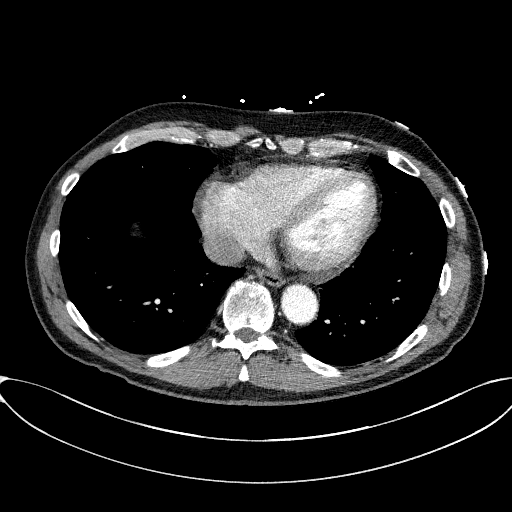
[im 73/196  lung]
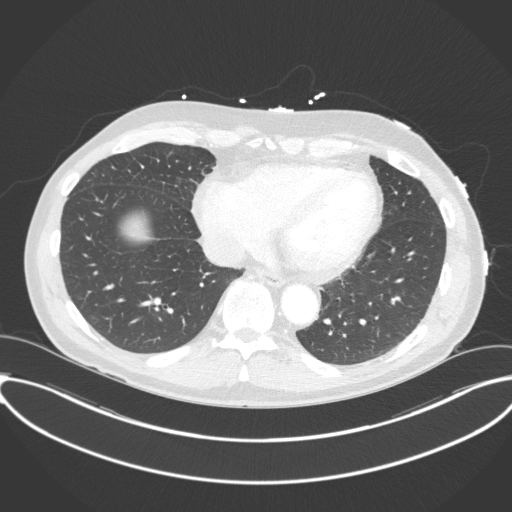
[im 87/196  lung]
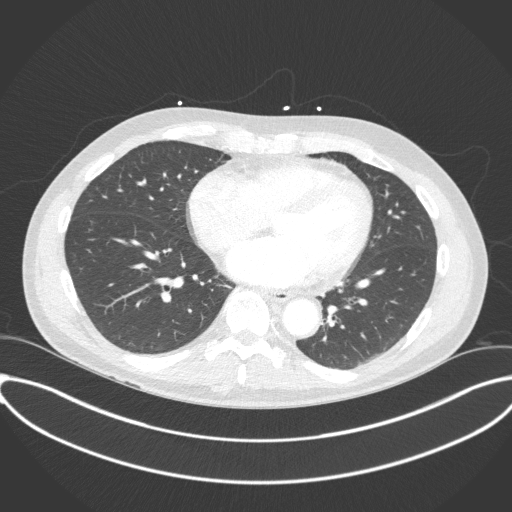
[im 109/196  lung]
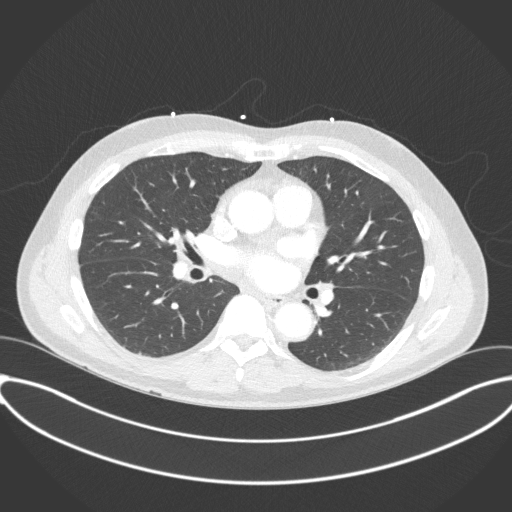
[im 123/196  lung]
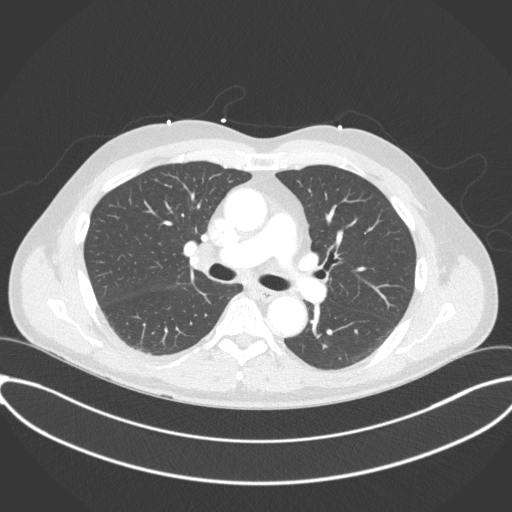
[im 138/196  mediastinal]
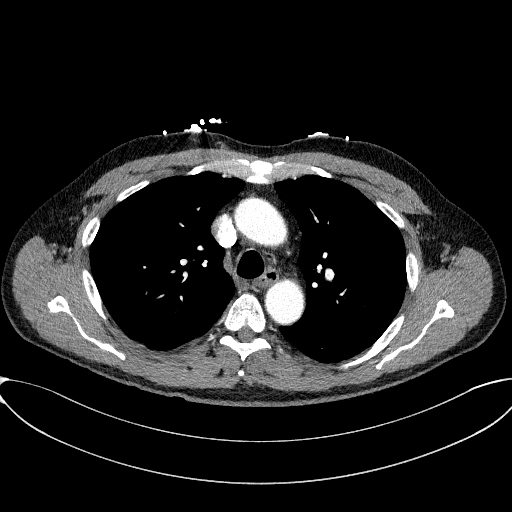
[im 138/196  lung]
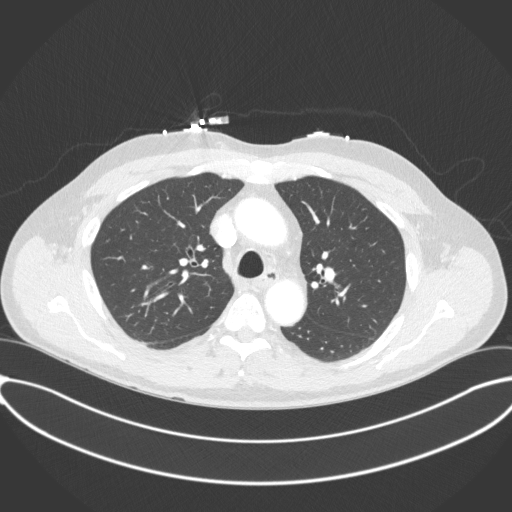
[im 152/196  lung]
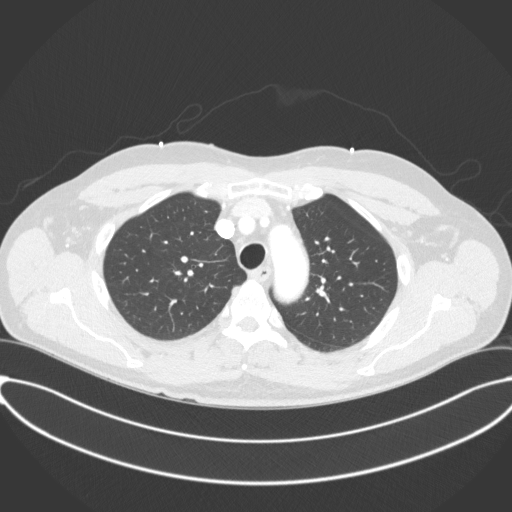
[im 167/196  lung]
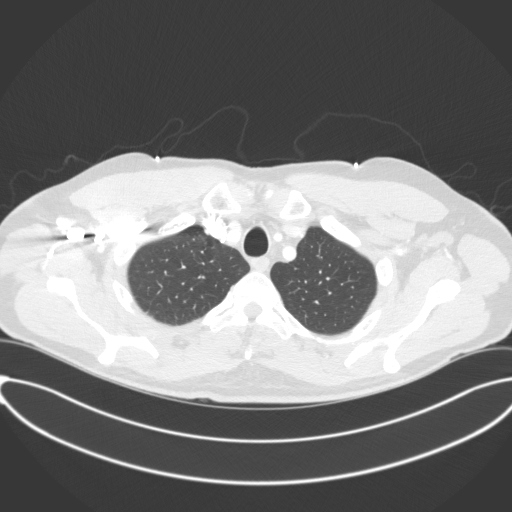
[im 181/196  lung]
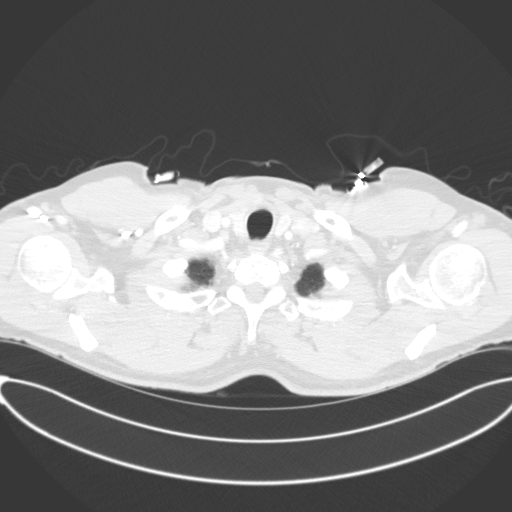

[Series 5: coronal · coronal · 0.80mm/px · 3 of 150 slices shown]
[im 30/150  lung]
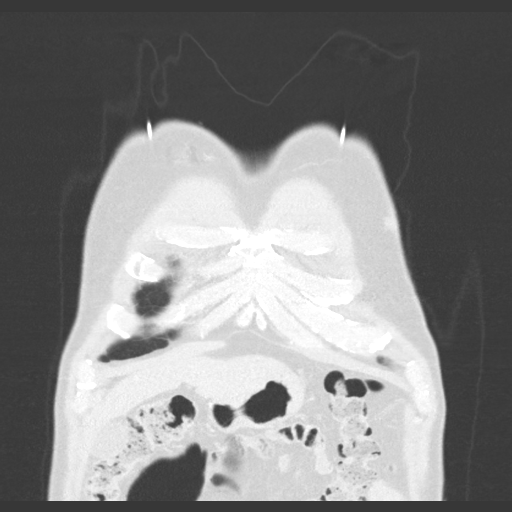
[im 60/150  lung]
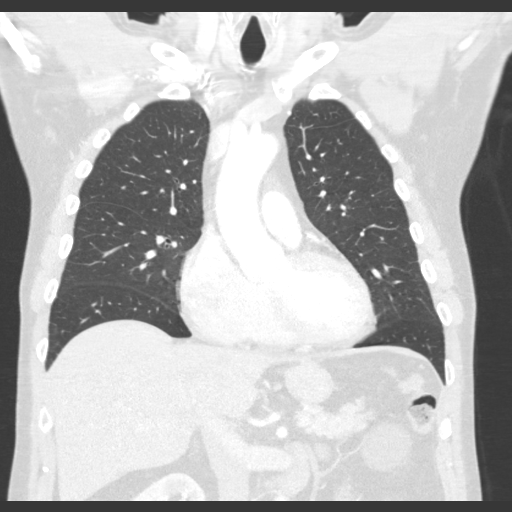
[im 90/150  lung]
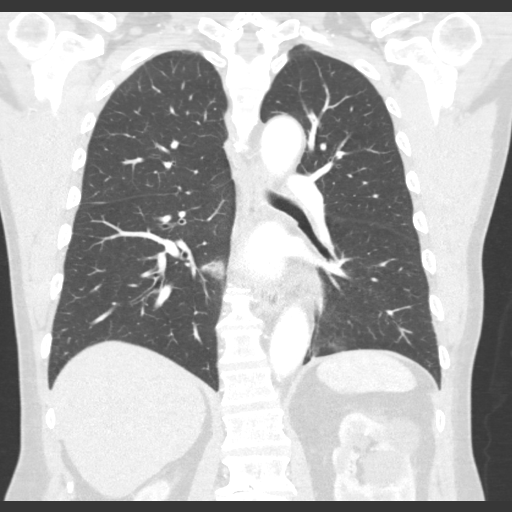

[15 of 36 positions shown; findings below may reference images not displayed]

FINDINGS: Cardiovascular: Negative visible aorta. The proximal great vessels
appear normal. Evidence of calcified left coronary artery
atherosclerosis on series 2, image 90. The other major mediastinal
vascular structures appear within normal limits. Borderline to mild
cardiomegaly. No pericardial effusion.

Mediastinum/Nodes: Negative. No lymphadenopathy. Unremarkable
thoracic inlet.

Lungs/Pleura:

The major airways are patent. There is no medial right upper lobe or
right paratracheal pulmonary abnormality. The radiographic
appearance from earlier today was artifact. There is minimal
dependent opacity in both lungs due to atelectasis. Otherwise both
lungs are clear. No pleural effusion.

Upper Abdomen: Negative visible liver, gallbladder, spleen,
pancreas, adrenal glands, and bowel in the upper abdomen.

There is a large left mid and upper pole renal cyst encompassing
about 8 centimeters diameter which has simple fluid density aside
from several small calcified septations (series 5, image 67). This
appears benign. There is a partially visible low-density right renal
midpole cyst laterally which is also likely benign. No upper
abdominal lymphadenopathy.

Musculoskeletal: Degenerative changes in the upper lumbar spine. No
acute osseous abnormality identified.
IMPRESSION: 1. Negative Chest CT aside from evidence of left coronary artery
calcified atherosclerosis.
2. The right paratracheal radiographic finding earlier today was
artifact.
3. Large left upper pole renal cyst which appears benign, several
thin calcified septae within the cyst are noted.

## 2018-04-18 ENCOUNTER — Ambulatory Visit (INDEPENDENT_AMBULATORY_CARE_PROVIDER_SITE_OTHER): Payer: BLUE CROSS/BLUE SHIELD | Admitting: Internal Medicine

## 2018-04-18 ENCOUNTER — Encounter: Payer: Self-pay | Admitting: Internal Medicine

## 2018-04-18 VITALS — BP 154/106 | HR 56 | Ht 71.5 in | Wt 186.6 lb

## 2018-04-18 DIAGNOSIS — E782 Mixed hyperlipidemia: Secondary | ICD-10-CM | POA: Diagnosis not present

## 2018-04-18 DIAGNOSIS — I2510 Atherosclerotic heart disease of native coronary artery without angina pectoris: Secondary | ICD-10-CM

## 2018-04-18 DIAGNOSIS — I483 Typical atrial flutter: Secondary | ICD-10-CM | POA: Diagnosis not present

## 2018-04-18 DIAGNOSIS — I10 Essential (primary) hypertension: Secondary | ICD-10-CM | POA: Diagnosis not present

## 2018-04-18 MED ORDER — AMLODIPINE BESYLATE 2.5 MG PO TABS: 2.5000 mg | ORAL_TABLET | Freq: Every day | ORAL | 3 refills | Status: DC

## 2018-04-18 NOTE — Patient Instructions (Signed)
Your physician has recommended you make the following change in your medication:  1.) start amlodipine 2.5 mg once a day for blood pressure  Your physician recommends that you schedule a follow-up appointment in: 4 weeks for blood pressure check.  Bring your blood pressure cuff with you. (Nurse visit)  Your physician wants you to follow-up in: January, 2020 with Dr. Harrington Challenger.  You will receive a reminder letter in the mail two months in advance. If you don't receive a letter, please call our office to schedule the follow-up appointment.

## 2018-04-18 NOTE — Progress Notes (Signed)
Cardiology Office Note   Date:  04/18/2018   ID:  Patrick Ferguson, DOB [birth year], MRN [id number]  PCP:  Deland Pretty, MD  Cardiologist:   Dorris Carnes, MD    Pt presents for f/u of atrial flutter  , HTN, CHF    History of Present Illness: Patrick Ferguson is a 62 y.o. male with a history of HTN and atrial flutter.  LVEF 30% when diagnosed   Underwent TEE cardioverison with recurrence.   In April 2019 underwent ablatoin.   REpeat echo LVEF 50 to 55%   Current Meds  Medication Sig  . metoprolol tartrate (LOPRESSOR) 50 MG tablet Take 25 mg by mouth 2 (two) times daily.  . rosuvastatin (CRESTOR) 5 MG tablet Take 1 tablet (5 mg total) by mouth daily.     Allergies:   Sulfites   Past Medical History:  Diagnosis Date  . Hypertension     Past Surgical History:  Procedure Laterality Date  . A-FLUTTER ABLATION N/A 11/18/2017   Procedure: A-FLUTTER ABLATION;  Surgeon: Evans Lance, MD;  Location: [facility];  Service: Cardiovascular;  Laterality: N/A;  . CARDIOVERSION N/A 10/10/2017   Procedure: CARDIOVERSION;  Surgeon: Larey Dresser, MD;  Location: [facility] ENDOSCOPY;  Service: Cardiovascular;  Laterality: N/A;  . TEE WITHOUT CARDIOVERSION N/A 10/10/2017   Procedure: TRANSESOPHAGEAL ECHOCARDIOGRAM (TEE);  Surgeon: Larey Dresser, MD;  Location: [facility] ENDOSCOPY;  Service: Cardiovascular;  Laterality: N/A;     Social History:  The patient  reports that he has never smoked. He has never used smokeless tobacco. He reports that he does not drink alcohol or use drugs.   Family History:  The patient's family history includes Heart disease in his father.    ROS:  Please see the history of present illness. All other systems are reviewed and  Negative to the above problem except as noted.    PHYSICAL EXAM: VS:  BP (!) 154/106   Pulse (!) 56   Ht 5' 11.5" (1.816 m)   Wt 186 lb 9.6 oz (84.6 kg)   SpO2 97%   BMI 25.66 kg/m   GEN: Pt is  in no acute distress  HEENT: normal  Neck:  JVP normal   No carotid bruits, or masses Cardiac: RRR ; no murmurs, rubs, or gallops,no LE  edema  Respiratory:  clear to auscultation bilaterally, normal work of breathing GI: soft, nontender, nondistended, + BS  No hepatomegaly  MS: no deformity Moving all extremities   Skin: warm and dry, no rash Neuro:  Strength and sensation are intact Psych: euthymic mood, full affect   EKG:  EKG is not ordered today     Lipid Panel    Component Value Date/Time   CHOL 163 02/20/2018 0737   TRIG 55 02/20/2018 0737   HDL 80 02/20/2018 0737   CHOLHDL 2.0 02/20/2018 0737   LDLCALC 72 02/20/2018 0737      Wt Readings from Last 3 Encounters:  04/18/18 186 lb 9.6 oz (84.6 kg)  12/23/17 182 lb (82.6 kg)  12/13/17 180 lb 12.8 oz (82 kg)      ASSESSMENT AND PLAN:  1  Atrial flutter .Pt is s/p ablation   He has not felt though he did not feel prior Curr in NR   He notes occasional hard pounding    I recomm apple watch to capture  I do not think flutter   Possible isolated PVCs I would not recomm an event monitor  2 Sstolic CHF LVEF is normal on echo  Normalized after return to SR Volume is good on exam    3 Coronary calcifications    Mild plaquing On statin  4  Lipids  Last LDL 72  HDL 80   Keep on Crestor  5  HTN  BP is up  Would add amlodipine 2.5 mg   Follow up with BP check in 4 wks   Bring cuff     I will see in Jan    Current medicines are reviewed at length with the patient today.  The patient does not have concerns regarding medicines.  Signed, Dorris Carnes, MD  04/18/2018 8:15 AM    [facility] [address], [place], [place]  [address] Phone: [phone]; Fax: [phone]

## 2018-05-13 ENCOUNTER — Ambulatory Visit (INDEPENDENT_AMBULATORY_CARE_PROVIDER_SITE_OTHER): Payer: BLUE CROSS/BLUE SHIELD | Admitting: *Deleted

## 2018-05-13 VITALS — BP 130/82 | HR 60 | Resp 18

## 2018-05-13 DIAGNOSIS — Z0130 Encounter for examination of blood pressure without abnormal findings: Secondary | ICD-10-CM | POA: Diagnosis not present

## 2018-05-13 NOTE — Progress Notes (Signed)
BP readings are good   I think he should stay on BP med He could consolidate to succinate (instead of tartrate ) if he thinks it is easier to take daily)

## 2018-05-13 NOTE — Progress Notes (Signed)
1.) Reason for visit: BP check after adding amlodipine at last ov  2.) Name of MD requesting visit: Dr. Harrington Challenger  3.) H&P: Home BP cuff readings: 132/86, 67, 141/87, 60, 123/83, 69  4.) ROS related to problem: BP:  130/80, HR 60, REGULAR.  5.) Brought cuff to compare but malfunctioned.  He is going to check batteries and try again.  Educated on correct cuff placement. Pt very interested in trying to get off BP meds. Adv to revisit with Dr. Harrington Challenger in Jan.  Appt made. ALSO OF NOTE, pcp ordered 1 week monitor -pt had physical and discussed occas heart pounding sensation.  6.) Assessment and plan per MD: Pt aware I am routing to Dr. Harrington Challenger for review.

## 2018-06-14 ENCOUNTER — Other Ambulatory Visit: Payer: Self-pay | Admitting: Internal Medicine

## 2018-08-18 DIAGNOSIS — L0390 Cellulitis, unspecified: Secondary | ICD-10-CM | POA: Diagnosis not present

## 2018-08-18 DIAGNOSIS — R030 Elevated blood-pressure reading, without diagnosis of hypertension: Secondary | ICD-10-CM | POA: Diagnosis not present

## 2018-08-18 DIAGNOSIS — B029 Zoster without complications: Secondary | ICD-10-CM | POA: Diagnosis not present

## 2018-08-30 NOTE — Progress Notes (Signed)
Cardiology Office Note   Date:  09/01/2018   ID:  Patrick Ferguson, DOB [birth year], MRN [id number]  PCP:  Deland Pretty, MD  Cardiologist:   Dorris Carnes, MD     f/u of HTN   Hx of atrial flutter (s/p ablation) and CHF    History of Present Illness: Patrick Ferguson is a 63 y.o. male with a history of HTN and atrial flutter.  LVEF 30% when diagnosed   Underwent TEE cardioverison with recurrence.   In April 2019 underwent ablatoin.   REpeat echo LVEF 50 to 55%   I saw the pt in clinic in September 2019  Follows at [facility] in September LDL 82, HDL 71   (has coronary calcification on CT)  Patient seen by Dr Loney Loh   Felt pounding   Wore monitor   This showed ocasional PVCs   On short burst of PAT and PVCs  The palpitaiojd resolved  He deneis dizziness   No CP   Active   Current Meds  Medication Sig  . amLODipine (NORVASC) 2.5 MG tablet TAKE 1 TABLET BY MOUTH EVERY DAY  . metoprolol tartrate (LOPRESSOR) 50 MG tablet Take 25 mg by mouth 2 (two) times daily.  . rosuvastatin (CRESTOR) 5 MG tablet Take 1 tablet (5 mg total) by mouth daily.     Allergies:   Sulfites   Past Medical History:  Diagnosis Date  . Hypertension     Past Surgical History:  Procedure Laterality Date  . A-FLUTTER ABLATION N/A 11/18/2017   Procedure: A-FLUTTER ABLATION;  Surgeon: Evans Lance, MD;  Location: [place] [facility];  Service: Cardiovascular;  Laterality: N/A;  . CARDIOVERSION N/A 10/10/2017   Procedure: CARDIOVERSION;  Surgeon: Larey Dresser, MD;  Location: [facility] ENDOSCOPY;  Service: Cardiovascular;  Laterality: N/A;  . TEE WITHOUT CARDIOVERSION N/A 10/10/2017   Procedure: TRANSESOPHAGEAL ECHOCARDIOGRAM (TEE);  Surgeon: Larey Dresser, MD;  Location: [facility] ENDOSCOPY;  Service: Cardiovascular;  Laterality: N/A;     Social History:  The patient  reports that he has never smoked. He has never used smokeless tobacco. He reports that he does not drink alcohol or use drugs.   Family History:  The  patient's family history includes Heart disease in his father.    ROS:  Please see the history of present illness. All other systems are reviewed and  Negative to the above problem except as noted.    PHYSICAL EXAM: VS:  BP 138/84   Pulse 68   Ht 5' 11.5" (1.816 m)   Wt 188 lb 9.6 oz (85.5 kg)   SpO2 96%   BMI 25.94 kg/m   BP on my check 160/100    With his cuff 168/ 106 (after talking about sexual dysfunction)  GEN: Pt is  in no acute distress  HEENT: normal  Neck: JVP normal   No carotid bruits, or masses Cardiac: RRR ; no murmurs, rubs, or gallops,no LE  edema  Respiratory:  clear to auscultation bilaterally, normal work of breathing GI: soft, nontender, nondistended, + BS  No hepatomegaly  MS: no deformity Moving all extremities   Skin: warm and dry, no rash Neuro:  Strength and sensation are intact Psych: euthymic mood, full affect   EKG:  EKG is not ordered today     Lipid Panel    Component Value Date/Time   CHOL 163 02/20/2018 0737   TRIG 55 02/20/2018 0737   HDL 80 02/20/2018 0737   CHOLHDL 2.0 02/20/2018 0737  LDLCALC 72 02/20/2018 0737      Wt Readings from Last 3 Encounters:  09/01/18 188 lb 9.6 oz (85.5 kg)  04/18/18 186 lb 9.6 oz (84.6 kg)  12/23/17 182 lb (82.6 kg)      ASSESSMENT AND PLAN:  1  Atrial flutter .S/p ablation   No recurrence  2  Palpitations    Some PVCs and PACs  Short burst of each   Monitor says that he had 30 sec of SVT    I disagree   What I see was mislabeled as SVT and was Sinus tach   2 Systolic CHF LVEF is normal on echo    3 Coronary calcifications    Mild plaquing On statin  4  Lipids  Last LDL 82  HDL 71  Contine    5  HTN  BP is up after talking about sexual dysfunction connections to meds     I would increae amlodipine to 5 mg   Switch toprol to 25 Toprol XL (should not have an effect on function )   F/U in 6 wk      Current medicines are reviewed at length with the patient today.  The patient does not have  concerns regarding medicines.  Signed, Dorris Carnes, MD  09/01/2018 8:40 AM    [facility] [address], [place], [place]  [address] Phone: [phone]; Fax: [phone]

## 2018-09-01 ENCOUNTER — Encounter: Payer: Self-pay | Admitting: Internal Medicine

## 2018-09-01 ENCOUNTER — Ambulatory Visit (INDEPENDENT_AMBULATORY_CARE_PROVIDER_SITE_OTHER): Payer: Medicare Other | Admitting: Internal Medicine

## 2018-09-01 VITALS — BP 138/84 | HR 68 | Ht 71.5 in | Wt 188.6 lb

## 2018-09-01 DIAGNOSIS — I10 Essential (primary) hypertension: Secondary | ICD-10-CM

## 2018-09-01 DIAGNOSIS — I2510 Atherosclerotic heart disease of native coronary artery without angina pectoris: Secondary | ICD-10-CM | POA: Diagnosis not present

## 2018-09-01 DIAGNOSIS — I483 Typical atrial flutter: Secondary | ICD-10-CM | POA: Diagnosis not present

## 2018-09-01 DIAGNOSIS — E782 Mixed hyperlipidemia: Secondary | ICD-10-CM

## 2018-09-01 MED ORDER — METOPROLOL SUCCINATE ER 25 MG PO TB24: 25.0000 mg | ORAL_TABLET | Freq: Every day | ORAL | 3 refills | Status: DC

## 2018-09-01 MED ORDER — AMLODIPINE BESYLATE 5 MG PO TABS: 5.0000 mg | ORAL_TABLET | Freq: Every day | ORAL | 3 refills | Status: DC

## 2018-09-01 NOTE — Patient Instructions (Signed)
Medication Instructions:  Your physician has recommended you make the following change in your medication:  1.) stop metoprolol tartrate 2.) start metoprolol succinate (Toprol XL) 25 mg once a day 3.) increase amlodipine to 5 mg once a day  If you need a refill on your cardiac medications before your next appointment, please call your pharmacy.   Lab work: none If you have labs (blood work) drawn today and your tests are completely normal, you will receive your results only by: Marland Kitchen MyChart Message (if you have MyChart) OR . A paper copy in the mail If you have any lab test that is abnormal or we need to change your treatment, we will call you to review the results.  Testing/Procedures: none  Follow-Up: Please schedule a follow up appointment in about 6 weeks with Dr. Harrington Challenger.   Any Other Special Instructions Will Be Listed Below (If Applicable).

## 2018-09-08 DIAGNOSIS — H40013 Open angle with borderline findings, low risk, bilateral: Secondary | ICD-10-CM | POA: Diagnosis not present

## 2018-10-10 ENCOUNTER — Ambulatory Visit: Payer: Medicare Other | Admitting: Internal Medicine

## 2018-10-17 DIAGNOSIS — R6889 Other general symptoms and signs: Secondary | ICD-10-CM | POA: Diagnosis not present

## 2018-10-21 ENCOUNTER — Telehealth: Payer: Self-pay | Admitting: Physician Assistant

## 2018-10-21 NOTE — Telephone Encounter (Signed)
Spoke with patient over phone. He has no recurrent palpitations. The patient denies nausea, vomiting, fever, chest pain, palpitations, shortness of breath, orthopnea, PND, dizziness, syncope, cough, congestion, abdominal pain, hematochezia, melena, lower extremity edema. Advised to take additional BB for intermittent palpitations but need to call if becomes everyday symptoms or persistent. 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

## 2018-10-28 ENCOUNTER — Ambulatory Visit: Payer: Medicare Other | Admitting: Physician Assistant

## 2018-11-29 ENCOUNTER — Other Ambulatory Visit: Payer: Self-pay | Admitting: Internal Medicine

## 2018-12-05 ENCOUNTER — Telehealth: Payer: Self-pay | Admitting: Physician Assistant

## 2018-12-05 NOTE — Telephone Encounter (Signed)
Spoke with patient who confirmed all demographics. Patient has a smart phone and uses My Chart. Will have vitals ready for visit. °

## 2018-12-12 ENCOUNTER — Telehealth: Payer: Self-pay | Admitting: Physician Assistant

## 2018-12-12 NOTE — Telephone Encounter (Signed)
New Message:    Patient concerning his appt. Patient need a code.

## 2018-12-12 NOTE — Telephone Encounter (Signed)
Returned pts call.  He has been made aware that he didn't need a code for his appt, that he would receive a text message from the provider once the assistant calls and goes over vitals, meds, allergies, history, etc  Pt thanked me for clarifying this for him.

## 2018-12-15 ENCOUNTER — Encounter: Payer: Self-pay | Admitting: Physician Assistant

## 2018-12-15 ENCOUNTER — Telehealth (INDEPENDENT_AMBULATORY_CARE_PROVIDER_SITE_OTHER): Payer: Medicare Other | Admitting: Physician Assistant

## 2018-12-15 ENCOUNTER — Other Ambulatory Visit: Payer: Self-pay

## 2018-12-15 VITALS — BP 158/91 | HR 56 | Temp 96.3°F | Ht 71.5 in | Wt 183.0 lb

## 2018-12-15 DIAGNOSIS — Z7189 Other specified counseling: Secondary | ICD-10-CM

## 2018-12-15 DIAGNOSIS — I4891 Unspecified atrial fibrillation: Secondary | ICD-10-CM | POA: Diagnosis not present

## 2018-12-15 DIAGNOSIS — I10 Essential (primary) hypertension: Secondary | ICD-10-CM

## 2018-12-15 DIAGNOSIS — E782 Mixed hyperlipidemia: Secondary | ICD-10-CM

## 2018-12-15 DIAGNOSIS — I5022 Chronic systolic (congestive) heart failure: Secondary | ICD-10-CM

## 2018-12-15 DIAGNOSIS — I2510 Atherosclerotic heart disease of native coronary artery without angina pectoris: Secondary | ICD-10-CM

## 2018-12-15 MED ORDER — AMLODIPINE BESYLATE 5 MG PO TABS: 5.0000 mg | ORAL_TABLET | Freq: Every day | ORAL | 3 refills | Status: DC

## 2018-12-15 NOTE — Patient Instructions (Signed)
Medication Instructions:  Your physician has recommended you make the following change in your medication:  1. INCREASE AMLODIPINE TO 5 MG DAILY.   If you need a refill on your cardiac medications before your next appointment, please call your pharmacy.   Lab work: NONE If you have labs (blood work) drawn today and your tests are completely normal, you will receive your results only by: Marland Kitchen MyChart Message (if you have MyChart) OR . A paper copy in the mail If you have any lab test that is abnormal or we need to change your treatment, we will call you to review the results.  Testing/Procedures: NONE  Follow-Up: At [facility], you and your health needs are our priority.  As part of our continuing mission to provide you with exceptional heart care, we have created designated Provider Care Teams.  These Care Teams include your primary Cardiologist (physician) and Advanced Practice Providers (APPs -  Physician Assistants and Nurse Practitioners) who all work together to provide you with the care you need, when you need it. You will need a follow up appointment in:  6 months.  Please call our office 2 months in advance to schedule this appointment.  You may see Dorris Carnes, MD or one of the following Advanced Practice Providers on your designated Care Team: Richardson Dopp, PA-C [place], [place] . Daune Perch, NP  Any Other Special Instructions Will Be Listed Below (If Applicable)

## 2018-12-15 NOTE — Progress Notes (Signed)
Virtual Visit via Video Note   This visit type was conducted due to national recommendations for restrictions regarding the COVID-19 Pandemic (e.g. social distancing) in an effort to limit this patient's exposure and mitigate transmission in our community.  Due to his co-morbid illnesses, this patient is at least at moderate risk for complications without adequate follow up.  This format is felt to be most appropriate for this patient at this time.  All issues noted in this document were discussed and addressed.  A limited physical exam was performed with this format.  Please refer to the patient's chart for his consent to telehealth for [facility].   Date:  12/15/2018   ID:  Patrick Ferguson, DOB 13-Sep-1955, MRN [id number]  Patient Location: Home Provider Location: Home  PCP:  Deland Pretty, MD  Cardiologist:  Dorris Carnes, MD  Electrophysiologist: Dr. Lovena Le  Evaluation Performed:  Follow-Up Visit  Chief Complaint:  3 months follow up  History of Present Illness:    Patrick Ferguson is a 63 y.o. male with hx HTN, atrial flutter s/p ablation, coronary calcification on CT, cardiomyopathy with improved LVEF and HLD seen for follow up.   LVEF 30% when diagnosed with aflutter, underwent TEE cardioverison with recurrence.   In April 2019 underwent ablatoin.   Repeat echo LVEF 50 to 55%. Discontinued Xarelto.  Had recurrent palpitation 05/2018. Monitor showed  ocasional PVCs, short burst of PAT and PVCs.   Last seen by Dr. Harrington Challenger 09/01/2018.   Patient is taking amlodipine 2.5 mg daily despite increasing to 5 mg last office visit with Dr. Harrington Challenger in January.  Blood pressure runs in upper 130s to 150s/80s to 90.  Patient continues to have intermittent palpitation, mostly noted while driving.  This occurs once every few weeks lasting for about 10 minutes.  No associated symptoms.  His symptoms is different when he was diagnosed with atrial flutter.  Patient had a lots of question regarding [id number]  cardiac complication.  All answered and patient is satisfied.  He walks 60 to 90 minutes each morning without any limitation.  The patient does not have symptoms concerning for COVID-19 infection (fever, chills, cough, or new shortness of breath).    Past Medical History:  Diagnosis Date  . Hypertension    Past Surgical History:  Procedure Laterality Date  . A-FLUTTER ABLATION N/A 11/18/2017   Procedure: A-FLUTTER ABLATION;  Surgeon: Evans Lance, MD;  Location: [place] CV LAB;  Service: Cardiovascular;  Laterality: N/A;  . CARDIOVERSION N/A 10/10/2017   Procedure: CARDIOVERSION;  Surgeon: Larey Dresser, MD;  Location: [facility] ENDOSCOPY;  Service: Cardiovascular;  Laterality: N/A;  . TEE WITHOUT CARDIOVERSION N/A 10/10/2017   Procedure: TRANSESOPHAGEAL ECHOCARDIOGRAM (TEE);  Surgeon: Larey Dresser, MD;  Location: [facility] ENDOSCOPY;  Service: Cardiovascular;  Laterality: N/A;     Current Meds  Medication Sig  . Cholecalciferol (VITAMIN D3) 50 MCG (2000 UT) TABS Take 4,000 Units by mouth daily.  . metoprolol succinate (TOPROL XL) 25 MG 24 hr tablet Take 1 tablet (25 mg total) by mouth daily.  . rosuvastatin (CRESTOR) 5 MG tablet TAKE ONE TABLET BY MOUTH DAILY  . UNABLE TO FIND Take 1 tablet by mouth daily. Ageless male  . [DISCONTINUED] amLODipine (NORVASC) 2.5 MG tablet Take 2.5 mg by mouth daily.     Allergies:   Sulfites   Social History   Tobacco Use  . Smoking status: Never Smoker  . Smokeless tobacco: Never Used  Substance Use Topics  . Alcohol  use: No    Frequency: Never  . Drug use: No     Family Hx: The patient's family history includes Heart disease in his father.  ROS:   Please see the history of present illness.    All other systems reviewed and are negative.   Prior CV studies:   The following studies were reviewed today:  As summarized above  Labs/Other Tests and Data Reviewed:    EKG:  No ECG reviewed.  Recent Labs: No results found for requested  labs within last 8760 hours.   Recent Lipid Panel Lab Results  Component Value Date/Time   CHOL 163 02/20/2018 07:37 AM   TRIG 55 02/20/2018 07:37 AM   HDL 80 02/20/2018 07:37 AM   CHOLHDL 2.0 02/20/2018 07:37 AM   LDLCALC 72 02/20/2018 07:37 AM    Wt Readings from Last 3 Encounters:  12/15/18 183 lb (83 kg)  09/01/18 188 lb 9.6 oz (85.5 kg)  04/18/18 186 lb 9.6 oz (84.6 kg)     Objective:    Vital Signs:  BP (!) 158/91   Pulse (!) 56   Temp (!) 96.3 F (35.7 C)   Ht 5' 11.5" (1.816 m)   Wt 183 lb (83 kg)   BMI 25.17 kg/m    VITAL SIGNS:  reviewed GEN:  no acute distress EYES:  sclerae anicteric, EOMI - Extraocular Movements Intact RESPIRATORY:  normal respiratory effort, symmetric expansion CARDIOVASCULAR:  no peripheral edema SKIN:  no rash, lesions or ulcers. MUSCULOSKELETAL:  no obvious deformities. NEURO:  alert and oriented x 3, no obvious focal deficit PSYCH:  normal affect  ASSESSMENT & PLAN:    1. Atrial flutter status post ablation -Previously on Xarelto which discontinue after ablation.  Patient continues to have stable palpitation.  Previous monitor did not show any arrhythmia as summarized above.  Continue Toprol-XL 25 mg daily.  He will give [place] a call if worsening of symptoms.  ChadVascs score of 2 for hypertension and age (3 if consider CHF however normalization of LV function). Will review with Dr. Harrington Challenger.   2.  Hypertension -Intermittently elevated.  Continue beta-blocker at current dose.  Increase amlodipine to 5 mg daily.  3.  Hyperlipidemia - 02/20/2018: Cholesterol, Total 163; HDL 80; LDL Calculated 72; Triglycerides 55  -Continue statin.   COVID-19 Education: The signs and symptoms of COVID-19 were discussed with the patient and how to seek care for testing (follow up with PCP or arrange E-visit). The importance of social distancing was discussed today.  Time:   Today, I have spent 24 minutes with the patient with telehealth technology  discussing the above problems.     Medication Adjustments/Labs and Tests Ordered: Current medicines are reviewed at length with the patient today.  Concerns regarding medicines are outlined above.   Tests Ordered: No orders of the defined types were placed in this encounter.   Medication Changes: Meds ordered this encounter  Medications  . amLODipine (NORVASC) 5 MG tablet    Sig: Take 1 tablet (5 mg total) by mouth daily.    Dispense:  90 tablet    Refill:  3    Disposition:  Follow up in 6 month(s)  Signed, Leanor Kail, PA  12/15/2018 8:51 AM    [facility]

## 2018-12-17 NOTE — Progress Notes (Signed)
S/p flutter ablation   NO Afib has been demonstrated I have asked him to consider an apple watch

## 2019-01-08 ENCOUNTER — Other Ambulatory Visit: Payer: Self-pay | Admitting: Internal Medicine

## 2019-01-08 MED ORDER — AMLODIPINE BESYLATE 5 MG PO TABS: 5.0000 mg | ORAL_TABLET | Freq: Every day | ORAL | 3 refills | Status: DC

## 2019-02-20 ENCOUNTER — Telehealth: Payer: Self-pay

## 2019-02-20 NOTE — Telephone Encounter (Signed)
Called and spoke with patient and made his office visit into a virtual video visit and confirmed that he will not come into office for appointment with Dr Harrington Challenger.

## 2019-03-07 NOTE — Progress Notes (Signed)
Virtual Visit via Video Note   This visit type was conducted due to national recommendations for restrictions regarding the COVID-19 Pandemic (e.g. social distancing) in an effort to limit this patient's exposure and mitigate transmission in our community.  Due to his co-morbid illnesses, this patient is at least at moderate risk for complications without adequate follow up.  This format is felt to be most appropriate for this patient at this time.  All issues noted in this document were discussed and addressed.  A limited physical exam was performed with this format.  Please refer to the patient's chart for his consent to telehealth for [facility].   Date:  03/09/2019   ID:  Patrick Ferguson, DOB June 26, 1956, MRN [id number]  Patient Location: Home Provider Location: Office  PCP:  Deland Pretty, MD  Cardiologist:  Dorris Carnes, MD  Electrophysiologist:  None   Evaluation Performed:  Follow-Up Visit  Chief Complaint:  F/U of palpitations   History of Present Illness:    Patrick Ferguson is a 63 y.o. male with hx HTN, atrial flutter s/p ablation, coronary calcification on CT, cardiomyopathy with improved LVEF and HLD seen for follow up.   LVEF 30% when diagnosed with aflutter,underwent TEE cardioverison with recurrence. In April 2019 underwent ablatoin. Repeat echo LVEF 50 to 55%. Discontinued Xarelto.  Had recurrent palpitation 05/2018. Monitor showed  ocasional PVCs, short burst of PAT and PVCs.   The pt was seen by B Bhagat in May 2020   I saw him back in Jan 2020.     The patient says he has been feeling good   No Cp  Breathinig is OK   Rare palpitation   Usually in bed        The patient {does not have symptoms concerning for COVID-19 infection (fever, chills, cough, or new shortness of breath).    Past Medical History:  Diagnosis Date  . Hypertension    Past Surgical History:  Procedure Laterality Date  . A-FLUTTER ABLATION N/A 11/18/2017   Procedure: A-FLUTTER  ABLATION;  Surgeon: Evans Lance, MD;  Location: [place] CV LAB;  Service: Cardiovascular;  Laterality: N/A;  . CARDIOVERSION N/A 10/10/2017   Procedure: CARDIOVERSION;  Surgeon: Larey Dresser, MD;  Location: [facility] ENDOSCOPY;  Service: Cardiovascular;  Laterality: N/A;  . TEE WITHOUT CARDIOVERSION N/A 10/10/2017   Procedure: TRANSESOPHAGEAL ECHOCARDIOGRAM (TEE);  Surgeon: Larey Dresser, MD;  Location: [facility] ENDOSCOPY;  Service: Cardiovascular;  Laterality: N/A;     Current Meds  Medication Sig  . amLODipine (NORVASC) 5 MG tablet Take 1 tablet (5 mg total) by mouth daily.  . Cholecalciferol (VITAMIN D3) 50 MCG (2000 UT) TABS Take 4,000 Units by mouth daily.  . metoprolol succinate (TOPROL XL) 25 MG 24 hr tablet Take 1 tablet (25 mg total) by mouth daily.  . rosuvastatin (CRESTOR) 5 MG tablet TAKE ONE TABLET BY MOUTH DAILY  . UNABLE TO FIND Take 1 tablet by mouth daily. 29 male     Allergies:   Sulfites   Social History   Tobacco Use  . Smoking status: Never Smoker  . Smokeless tobacco: Never Used  Substance Use Topics  . Alcohol use: No    Frequency: Never  . Drug use: No     Family Hx: The patient's family history includes Heart disease in his father.  ROS:   Please see the history of present illness.     All other systems reviewed and are negative.   Prior CV studies:  The following studies were reviewed today:    Labs/Other Tests and Data Reviewed:    EKG:  No ECG reviewed.  Recent Labs: No results found for requested labs within last 8760 hours.   Recent Lipid Panel Lab Results  Component Value Date/Time   CHOL 163 02/20/2018 07:37 AM   TRIG 55 02/20/2018 07:37 AM   HDL 80 02/20/2018 07:37 AM   CHOLHDL 2.0 02/20/2018 07:37 AM   LDLCALC 72 02/20/2018 07:37 AM    Wt Readings from Last 3 Encounters:  03/09/19 185 lb (83.9 kg)  12/15/18 183 lb (83 kg)  09/01/18 188 lb 9.6 oz (85.5 kg)     Objective:    Vital Signs:  BP (!) 139/91   Pulse 60    Ht 5' 11.5" (1.816 m)   Wt 185 lb (83.9 kg)   BMI 25.44 kg/m    BP since last week   132/94   HR 58   113/64    142/  ASSESSMENT & PLAN:    1.  Atrial flutter   S/p ablation   Doing well   No documented arrhythmia sinced  2  Systolic CHF   Pt had depressed LVEF when in atrial flutter, probably rate related   Improved     Will follow clinically    2   HTN  Wlll check BP more at home and send values in my chart  3   HL  Continue Crestor    COVID-19 Education: The signs and symptoms of COVID-19 were discussed with the patient and how to seek care for testing (follow up with PCP or arrange E-visit).  The importance of social distancing was discussed today.  Time:   Today, I have spent  18minutes with the patient with telehealth technology discussing the above problems.     Medication Adjustments/Labs and Tests Ordered: Current medicines are reviewed at length with the patient today.  Concerns regarding medicines are outlined above.   Tests Ordered: Labs  *CBC, lipids, TSH, PSA    Medication Changes: No orders of the defined types were placed in this encounter.   Follow Up:  6 months   Signed, Dorris Carnes, MD  03/09/2019 10:42 AM    [place]

## 2019-03-09 ENCOUNTER — Telehealth (INDEPENDENT_AMBULATORY_CARE_PROVIDER_SITE_OTHER): Payer: Medicare Other | Admitting: Internal Medicine

## 2019-03-09 ENCOUNTER — Encounter: Payer: Self-pay | Admitting: Internal Medicine

## 2019-03-09 ENCOUNTER — Other Ambulatory Visit: Payer: Self-pay

## 2019-03-09 VITALS — BP 139/91 | HR 60 | Ht 71.5 in | Wt 185.0 lb

## 2019-03-09 DIAGNOSIS — I4891 Unspecified atrial fibrillation: Secondary | ICD-10-CM | POA: Diagnosis not present

## 2019-03-09 DIAGNOSIS — I2510 Atherosclerotic heart disease of native coronary artery without angina pectoris: Secondary | ICD-10-CM

## 2019-03-09 DIAGNOSIS — I5022 Chronic systolic (congestive) heart failure: Secondary | ICD-10-CM

## 2019-03-09 DIAGNOSIS — E782 Mixed hyperlipidemia: Secondary | ICD-10-CM

## 2019-03-09 NOTE — Patient Instructions (Signed)
Medication Instructions:  Your physician recommends that you continue on your current medications as directed. Please refer to the Current Medication list given to you today.  If you need a refill on your cardiac medications before your next appointment, please call your pharmacy.   Lab work: CBC, BMET, TSH, PSA and Lipids when you are fasting (nothing to eat or drink after midnight except water and black coffee).  If you have labs (blood work) drawn today and your tests are completely normal, you will receive your results only by: Marland Kitchen MyChart Message (if you have MyChart) OR . A paper copy in the mail If you have any lab test that is abnormal or we need to change your treatment, we will call you to review the results.  Testing/Procedures: None  Follow-Up: At [facility], you and your health needs are our priority.  As part of our continuing mission to provide you with exceptional heart care, we have created designated Provider Care Teams.  These Care Teams include your primary Cardiologist (physician) and Advanced Practice Providers (APPs -  Physician Assistants and Nurse Practitioners) who all work together to provide you with the care you need, when you need it. You will need a follow up appointment in:  7 months.  Please call our office 2 months in advance to schedule this appointment.  You may see Dorris Carnes, MD or one of the following Advanced Practice Providers on your designated Care Team: Richardson Dopp, PA-C [place], [place] . Daune Perch, NP  Any Other Special Instructions Will Be Listed Below (If Applicable).

## 2019-03-13 ENCOUNTER — Other Ambulatory Visit: Payer: Medicare Other

## 2019-03-31 DIAGNOSIS — R5383 Other fatigue: Secondary | ICD-10-CM | POA: Diagnosis not present

## 2019-03-31 DIAGNOSIS — R030 Elevated blood-pressure reading, without diagnosis of hypertension: Secondary | ICD-10-CM | POA: Diagnosis not present

## 2019-03-31 DIAGNOSIS — Z125 Encounter for screening for malignant neoplasm of prostate: Secondary | ICD-10-CM | POA: Diagnosis not present

## 2019-03-31 DIAGNOSIS — E7800 Pure hypercholesterolemia, unspecified: Secondary | ICD-10-CM | POA: Diagnosis not present

## 2019-04-08 DIAGNOSIS — K429 Umbilical hernia without obstruction or gangrene: Secondary | ICD-10-CM | POA: Diagnosis not present

## 2019-04-08 DIAGNOSIS — I493 Ventricular premature depolarization: Secondary | ICD-10-CM | POA: Diagnosis not present

## 2019-04-08 DIAGNOSIS — Z9102 Food additives allergy status: Secondary | ICD-10-CM | POA: Diagnosis not present

## 2019-04-08 DIAGNOSIS — Z125 Encounter for screening for malignant neoplasm of prostate: Secondary | ICD-10-CM | POA: Diagnosis not present

## 2019-04-08 DIAGNOSIS — E7800 Pure hypercholesterolemia, unspecified: Secondary | ICD-10-CM | POA: Diagnosis not present

## 2019-04-08 DIAGNOSIS — Z0000 Encounter for general adult medical examination without abnormal findings: Secondary | ICD-10-CM | POA: Diagnosis not present

## 2019-04-08 DIAGNOSIS — Z23 Encounter for immunization: Secondary | ICD-10-CM | POA: Diagnosis not present

## 2019-04-08 DIAGNOSIS — Z87898 Personal history of other specified conditions: Secondary | ICD-10-CM | POA: Diagnosis not present

## 2019-04-08 DIAGNOSIS — R030 Elevated blood-pressure reading, without diagnosis of hypertension: Secondary | ICD-10-CM | POA: Diagnosis not present

## 2019-06-02 ENCOUNTER — Other Ambulatory Visit: Payer: Self-pay

## 2019-06-02 DIAGNOSIS — Z20822 Contact with and (suspected) exposure to covid-19: Secondary | ICD-10-CM

## 2019-06-04 LAB — NOVEL CORONAVIRUS, NAA: SARS-CoV-2, NAA: NOT DETECTED

## 2019-06-24 DIAGNOSIS — R05 Cough: Secondary | ICD-10-CM | POA: Diagnosis not present

## 2019-06-24 DIAGNOSIS — Z20828 Contact with and (suspected) exposure to other viral communicable diseases: Secondary | ICD-10-CM | POA: Diagnosis not present

## 2019-06-24 DIAGNOSIS — R509 Fever, unspecified: Secondary | ICD-10-CM | POA: Diagnosis not present

## 2019-07-01 ENCOUNTER — Encounter (HOSPITAL_COMMUNITY): Payer: Self-pay | Admitting: Emergency Medicine

## 2019-07-01 ENCOUNTER — Emergency Department (HOSPITAL_COMMUNITY): Payer: Medicare Other

## 2019-07-01 ENCOUNTER — Other Ambulatory Visit: Payer: Self-pay

## 2019-07-01 ENCOUNTER — Emergency Department (HOSPITAL_COMMUNITY)
Admission: EM | Admit: 2019-07-01 | Discharge: 2019-07-01 | Disposition: A | Discharge status: Home / Self Care | Payer: Medicare Other | Attending: Emergency Medicine | Admitting: Emergency Medicine

## 2019-07-01 DIAGNOSIS — I10 Essential (primary) hypertension: Secondary | ICD-10-CM | POA: Diagnosis not present

## 2019-07-01 DIAGNOSIS — M7918 Myalgia, other site: Secondary | ICD-10-CM | POA: Insufficient documentation

## 2019-07-01 DIAGNOSIS — J069 Acute upper respiratory infection, unspecified: Secondary | ICD-10-CM

## 2019-07-01 DIAGNOSIS — U071 COVID-19: Secondary | ICD-10-CM | POA: Insufficient documentation

## 2019-07-01 DIAGNOSIS — R438 Other disturbances of smell and taste: Secondary | ICD-10-CM | POA: Diagnosis not present

## 2019-07-01 DIAGNOSIS — Z79899 Other long term (current) drug therapy: Secondary | ICD-10-CM | POA: Insufficient documentation

## 2019-07-01 DIAGNOSIS — R05 Cough: Secondary | ICD-10-CM | POA: Diagnosis not present

## 2019-07-01 DIAGNOSIS — I4892 Unspecified atrial flutter: Secondary | ICD-10-CM | POA: Insufficient documentation

## 2019-07-01 DIAGNOSIS — J988 Other specified respiratory disorders: Secondary | ICD-10-CM | POA: Diagnosis not present

## 2019-07-01 NOTE — ED Notes (Signed)
Pt discharge instructions reviewed with patient. Pt verbalized understanding. Pt discharged.

## 2019-07-01 NOTE — ED Provider Notes (Signed)
[place] EMERGENCY DEPARTMENT Provider Note   CSN: [id number] Arrival date & time: 07/01/19  1514     History   Chief Complaint Chief Complaint  Patient presents with  . Cough  . covid +    HPI Patrick Ferguson is a 63 y.o. male.     Patrick Ferguson is a 63 y.o. male with a history of hypertension and atrial flutter, who presents to the ED for evaluation of cough.  Patient was diagnosed with Covid 6 days ago and symptoms started exactly 1 week ago.  He reports he has had mild fevers up to 100.8, associated with body aches, fatigue and a productive cough.  Patient presents today wanting to get his lungs checked.  He reports that he has heard many people worsen after 1 week of symptoms.  He denies any shortness of breath.  He reports that he has some chest soreness after coughing but has no chest pain.  He has had loss of taste and smell.  He denies diarrhea, vomiting or abdominal pain.  Reports that he has been monitoring his oxygen at home with pulse ox and it has not dropped below 95%.  He has been using Tylenol and Motrin to treat his pain and fever and his PCP prescribed him a Z-Pak which he completes today.  No other aggravating or alleviating factors.     Past Medical History:  Diagnosis Date  . Hypertension     Patient Active Problem List   Diagnosis Date Noted  . Typical atrial flutter ([place]) 11/18/2017    Past Surgical History:  Procedure Laterality Date  . A-FLUTTER ABLATION N/A 11/18/2017   Procedure: A-FLUTTER ABLATION;  Surgeon: Evans Lance, MD;  Location: [place] CV LAB;  Service: Cardiovascular;  Laterality: N/A;  . CARDIOVERSION N/A 10/10/2017   Procedure: CARDIOVERSION;  Surgeon: Larey Dresser, MD;  Location: [facility] ENDOSCOPY;  Service: Cardiovascular;  Laterality: N/A;  . TEE WITHOUT CARDIOVERSION N/A 10/10/2017   Procedure: TRANSESOPHAGEAL ECHOCARDIOGRAM (TEE);  Surgeon: Larey Dresser, MD;  Location: [facility] ENDOSCOPY;  Service:  Cardiovascular;  Laterality: N/A;        Home Medications    Prior to Admission medications   Medication Sig Start Date End Date Taking? Authorizing Provider  amLODipine (NORVASC) 5 MG tablet Take 1 tablet (5 mg total) by mouth daily. 01/08/19   Fay Records, MD  Cholecalciferol (VITAMIN D3) 50 MCG (2000 UT) TABS Take 4,000 Units by mouth daily.    [provider]  metoprolol succinate (TOPROL XL) 25 MG 24 hr tablet Take 1 tablet (25 mg total) by mouth daily. 09/01/18   Fay Records, MD  rosuvastatin (CRESTOR) 5 MG tablet TAKE ONE TABLET BY MOUTH DAILY 11/29/18   Fay Records, MD  UNABLE TO FIND Take 1 tablet by mouth daily. Ageless male    [provider]    Family History Family History  Problem Relation Age of Onset  . Heart disease Father     Social History Social History   Tobacco Use  . Smoking status: Never Smoker  . Smokeless tobacco: Never Used  Substance Use Topics  . Alcohol use: No    Frequency: Never  . Drug use: No     Allergies   Sulfites   Review of Systems Review of Systems  Constitutional: Positive for chills and fever.  HENT: Positive for congestion and rhinorrhea.   Respiratory: Positive for cough. Negative for shortness of breath.   Cardiovascular: Negative  for chest pain.  Gastrointestinal: Negative for abdominal pain, diarrhea, nausea and vomiting.  Musculoskeletal: Positive for myalgias.  All other systems reviewed and are negative.    Physical Exam Updated Vital Signs BP 122/86   Pulse 79   Temp 98.6 F (37 C) (Oral)   Resp 18   SpO2 97%   Physical Exam Vitals signs and nursing note reviewed.  Constitutional:      General: He is not in acute distress.    Appearance: Normal appearance. He is well-developed and normal weight. He is not ill-appearing or diaphoretic.     Comments: Well-appearing and in no distress  HENT:     Head: Normocephalic and atraumatic.  Eyes:     General:        Right eye: No  discharge.        Left eye: No discharge.  Neck:     Musculoskeletal: Neck supple.     Comments: No rigidity Cardiovascular:     Rate and Rhythm: Normal rate and regular rhythm.     Heart sounds: Normal heart sounds.  Pulmonary:     Effort: Pulmonary effort is normal. No respiratory distress.     Breath sounds: Normal breath sounds.     Comments: Respirations equal and unlabored, patient able to speak in full sentences, lungs clear to auscultation bilaterally Abdominal:     General: Bowel sounds are normal. There is no distension.     Palpations: Abdomen is soft. There is no mass.     Tenderness: There is no abdominal tenderness. There is no guarding.     Comments: Abdomen soft, nondistended, nontender to palpation in all quadrants without guarding or peritoneal signs  Musculoskeletal:        General: No deformity.  Lymphadenopathy:     Cervical: No cervical adenopathy.  Skin:    General: Skin is warm and dry.     Capillary Refill: Capillary refill takes less than 2 seconds.  Neurological:     Mental Status: He is alert and oriented to person, place, and time.  Psychiatric:        Mood and Affect: Mood normal.        Behavior: Behavior normal.      ED Treatments / Results  Labs (all labs ordered are listed, but only abnormal results are displayed) Labs Reviewed - No data to display  EKG None  Radiology Dg Chest Portable 1 View  Result Date: 07/01/2019 CLINICAL DATA:  COVID-19 positive, cough EXAM: PORTABLE CHEST 1 VIEW COMPARISON:  09/26/2017 FINDINGS: The heart size and mediastinal contours are within normal limits. There are mild hazy airspace opacities within the peripheral aspect of the mid to lower lung zones bilaterally. The visualized skeletal structures are unremarkable. IMPRESSION: Mild hazy airspace opacities in the peripheral aspect of the mid to lower lung zones bilaterally suspicious for viral pneumonia given patient history. Electronically Signed   By:  Davina Poke M.D.   On: 07/01/2019 16:14    Procedures Procedures (including critical care time)  Medications Ordered in ED Medications - No data to display   Initial Impression / Assessment and Plan / ED Course  I have reviewed the triage vital signs and the nursing notes.  Pertinent labs & imaging results that were available during my care of the patient were reviewed by me and considered in my medical decision making (see chart for details).  63 year old male diagnosed with Covid 6 days ago has had 1 week of symptoms, has not had shortness  of breath but reports he has had persistent productive cough and wanted to have his lungs checked today.  On arrival his vitals are normal.  He reports mild fevers at home which he has been managing with Tylenol and ibuprofen.  He was able to ambulate from the waiting room to the exam room with no increased work of breathing and had oxygen saturation of 97%.  Chest x-ray shows some mild hazy airspace opacities in the lower lungs consistent with COVID-19.  Lungs are clear on auscultation.  Provided patient with reassurance, he has a pulse ox at home that he can use to continue to monitor his oxygen.  Strict return precautions discussed.  Patient expresses understanding and agreement.  Discharged home in good condition.  Patrick Ferguson was evaluated in Emergency Department on 07/01/2019 for the symptoms described in the history of present illness. He was evaluated in the context of the global COVID-19 pandemic, which necessitated consideration that the patient might be at risk for infection with the SARS-CoV-2 virus that causes COVID-19. Institutional protocols and algorithms that pertain to the evaluation of patients at risk for COVID-19 are in a state of rapid change based on information released by regulatory bodies including the CDC and federal and state organizations. These policies and algorithms were followed during the patient's care in the ED.   Final Clinical Impressions(s) / ED Diagnoses   Final diagnoses:  Acute respiratory disease due to COVID-19 virus    ED Discharge Orders    None       Jacqlyn Larsen, [place] 07/01/19 1653    Margette Fast, MD 07/03/19 1355

## 2019-07-01 NOTE — ED Notes (Signed)
Ambulated Pt from front of Waiting Room to RM 12 without O2. Pt's Pulse Ox stayed at 97%.

## 2019-07-01 NOTE — Discharge Instructions (Signed)
Your chest x-ray showed some hazy opacities in the lung bases which we typically see with COVID-19.  Antibiotics are not helpful in treating viral infection, the virus should run its course in about 10-14 days. Please make sure you are drinking plenty of fluids. You can treat your symptoms supportively with tylenol and motrin for fevers and pains, and over the counter cough syrups and throat lozenges to help with cough. If your symptoms are not improving please follow up with you Primary doctor.   I recommend that you purchase a home pulse ox to help better monitor your oxygen at home, if you start to have increased work of breathing or shortness of breath or your oxygen drops below 92% please immediately return to the hospital for reevaluation.  If you develop persistent fevers, shortness of breath or difficulty breathing, chest pain, severe headache and neck pain, persistent nausea and vomiting or other new or concerning symptoms return to the Emergency department.

## 2019-07-01 NOTE — ED Triage Notes (Signed)
Pt reports being Covid + and wants to get his lungs checked. Denies SOB. Endorses a productive cough and some pain when coughing.

## 2019-07-15 ENCOUNTER — Other Ambulatory Visit: Payer: Self-pay

## 2019-07-15 DIAGNOSIS — Z20828 Contact with and (suspected) exposure to other viral communicable diseases: Secondary | ICD-10-CM | POA: Diagnosis not present

## 2019-07-15 DIAGNOSIS — Z20822 Contact with and (suspected) exposure to covid-19: Secondary | ICD-10-CM

## 2019-07-16 LAB — NOVEL CORONAVIRUS, NAA: SARS-CoV-2, NAA: NOT DETECTED

## 2019-07-27 DIAGNOSIS — Z0184 Encounter for antibody response examination: Secondary | ICD-10-CM | POA: Diagnosis not present

## 2019-08-12 DIAGNOSIS — Z20828 Contact with and (suspected) exposure to other viral communicable diseases: Secondary | ICD-10-CM | POA: Diagnosis not present

## 2019-08-19 ENCOUNTER — Other Ambulatory Visit: Payer: Self-pay | Admitting: Internal Medicine

## 2019-08-20 DIAGNOSIS — H43811 Vitreous degeneration, right eye: Secondary | ICD-10-CM | POA: Diagnosis not present

## 2019-08-20 DIAGNOSIS — H40013 Open angle with borderline findings, low risk, bilateral: Secondary | ICD-10-CM | POA: Diagnosis not present

## 2019-08-20 DIAGNOSIS — H353132 Nonexudative age-related macular degeneration, bilateral, intermediate dry stage: Secondary | ICD-10-CM | POA: Diagnosis not present

## 2019-08-20 DIAGNOSIS — H43393 Other vitreous opacities, bilateral: Secondary | ICD-10-CM | POA: Diagnosis not present

## 2019-09-21 ENCOUNTER — Encounter: Payer: Self-pay | Admitting: Internal Medicine

## 2019-09-21 ENCOUNTER — Other Ambulatory Visit: Payer: Self-pay

## 2019-09-21 ENCOUNTER — Ambulatory Visit (INDEPENDENT_AMBULATORY_CARE_PROVIDER_SITE_OTHER): Payer: Medicare Other | Admitting: Internal Medicine

## 2019-09-21 VITALS — BP 158/92 | HR 57 | Temp 97.8°F | Ht 71.0 in

## 2019-09-21 DIAGNOSIS — I10 Essential (primary) hypertension: Secondary | ICD-10-CM

## 2019-09-21 DIAGNOSIS — I2510 Atherosclerotic heart disease of native coronary artery without angina pectoris: Secondary | ICD-10-CM | POA: Diagnosis not present

## 2019-09-21 DIAGNOSIS — E782 Mixed hyperlipidemia: Secondary | ICD-10-CM | POA: Diagnosis not present

## 2019-09-21 MED ORDER — AMLODIPINE BESYLATE 5 MG PO TABS: 5.0000 mg | ORAL_TABLET | Freq: Two times a day (BID) | ORAL | 3 refills | Status: DC

## 2019-09-21 NOTE — Patient Instructions (Signed)
Recommend:  1  Toprol XL   Cut in 1/2 tab  Take daily for 5 days then stop  2  INcrease amlodipine to 5 mg 2x per day    If tolerating with no dizziness can take as 10 mg 1x per day.  3  Call / email (MyChart) BP readings in 3 weeks.  4.  F/U appt in July.2021

## 2019-09-21 NOTE — Progress Notes (Signed)
Cardiology Office Note   Date:  09/21/2019   ID:  Ordell Zerr, DOB 12-29-1955, MRN [id number]  PCP:  Deland Pretty, MD  Cardiologist:   Dorris Carnes, MD     f/u of HTN   Hx of atrial flutter (s/p ablation) and CHF    History of Present Illness: Patrick Ferguson is a 64 y.o. male with a history of HTN and atrial flutter.  LVEF 30% when diagnosed   Underwent TEE cardioverison with recurrence.   In April 2019 underwent ablatoin.   REpeat echo LVEF 50 to 55%  Pt had episode of heart pounding in the past  Wore an event monitor   Only occaisonal PVCs, short burst PAT seen  Resolved I last saw the pt in a televisit in Aug 2020 Since then he says he had COVID in late Nov 2020  Very sick  Not hospitalized  Sats transiently in upper 80%  Recovered   Helped famly member with COVID infection in Dec Currently says breathing is OK   He denies palpitations  No dizziness  No CP    Does note problems with erectile dysfunction  QUestions if related to meds    Current Meds  Medication Sig  . amLODipine (NORVASC) 5 MG tablet Take 1 tablet (5 mg total) by mouth daily.  . Cholecalciferol (VITAMIN D3) 50 MCG (2000 UT) TABS Take 4,000 Units by mouth daily.  . metoprolol succinate (TOPROL-XL) 25 MG 24 hr tablet TAKE ONE TABLET BY MOUTH DAILY  . rosuvastatin (CRESTOR) 5 MG tablet TAKE ONE TABLET BY MOUTH DAILY  . UNABLE TO FIND Take 1 tablet by mouth daily. 77 male     Allergies:   Sulfites   Past Medical History:  Diagnosis Date  . Hypertension     Past Surgical History:  Procedure Laterality Date  . A-FLUTTER ABLATION N/A 11/18/2017   Procedure: A-FLUTTER ABLATION;  Surgeon: Evans Lance, MD;  Location: [place] [facility];  Service: Cardiovascular;  Laterality: N/A;  . CARDIOVERSION N/A 10/10/2017   Procedure: CARDIOVERSION;  Surgeon: Larey Dresser, MD;  Location: [facility] ENDOSCOPY;  Service: Cardiovascular;  Laterality: N/A;  . TEE WITHOUT CARDIOVERSION N/A 10/10/2017   Procedure:  TRANSESOPHAGEAL ECHOCARDIOGRAM (TEE);  Surgeon: Larey Dresser, MD;  Location: [facility] ENDOSCOPY;  Service: Cardiovascular;  Laterality: N/A;     Social History:  The patient  reports that he has never smoked. He has never used smokeless tobacco. He reports that he does not drink alcohol or use drugs.   Family History:  The patient's family history includes Heart disease in his father.    ROS:  Please see the history of present illness. All other systems are reviewed and  Negative to the above problem except as noted.    PHYSICAL EXAM: VS:  BP (!) 158/92   Pulse (!) 57   Temp 97.8 F (36.6 C)   Ht 5\' 11"  (1.803 m)   SpO2 99%   BMI 25.80 kg/m   GEN: Pt is  in no acute distress  HEENT: normal  Neck: JVP normal   No carotid bruits Cardiac: RRR ; no murmurs, rubs, or gallops,no LE  edema  Respiratory:  clear to auscultation bilaterally, normal work of breathing GI: soft, nontender, nondistended, + BS  No hepatomegaly  MS: no deformity Moving all extremities   Skin: warm and dry, no rash Neuro:  Strength and sensation are intact Psych: euthymic mood, full affect   EKG:  EKG is not ordered today  Lipid Panel    Component Value Date/Time   CHOL 163 02/20/2018 0737   TRIG 55 02/20/2018 0737   HDL 80 02/20/2018 0737   CHOLHDL 2.0 02/20/2018 0737   LDLCALC 72 02/20/2018 0737      Wt Readings from Last 3 Encounters:  03/09/19 185 lb (83.9 kg)  12/15/18 183 lb (83 kg)  09/01/18 188 lb 9.6 oz (85.5 kg)      ASSESSMENT AND PLAN:  1  Atrial flutter Pt is s/p ablation   Currently without symptoms   No palptiations or heart racing  2  Hx systolic CHF  Most likely tachy induced   LVEF has normalized by echo  3  CAD  Pt with evid of CAD with calcifications of LAD on CT scan in past   He is asymptomatic   Modify risk factors   Need to get lipids  Control BP  4  Hx HTN  BP is not controlled   With concerns of ED with taper toprol to off   (12.5 mg x 5 days then dc)  Increase  amlodipine to 10 mg    Follow BP  He will communicate back  May need another agenty Denies sleep apnea symptoms  5  Lipids  On statin   Need to get most recent panel  6.  ED   Will give trial of Viagra.  I encouraged him to get eval at [facility]    Plan for follow up in summer      Current medicines are reviewed at length with the patient today.  The patient does not have concerns regarding medicines.  Signed, Dorris Carnes, MD  09/21/2019 3:46 PM    [address], [place], [place]  [address] Phone: [phone]; Fax: [phone]

## 2019-09-22 ENCOUNTER — Other Ambulatory Visit: Payer: Self-pay | Admitting: *Deleted

## 2019-09-22 DIAGNOSIS — E782 Mixed hyperlipidemia: Secondary | ICD-10-CM

## 2019-09-22 DIAGNOSIS — I2510 Atherosclerotic heart disease of native coronary artery without angina pectoris: Secondary | ICD-10-CM

## 2019-09-22 NOTE — Telephone Encounter (Signed)
-----   Message from Dorris Carnes V, MD sent at 09/22/2019 11:29 AM EST ----- Reviewed labs from PCP   LDL was 91   He has calcifications of LAD on CT in past  No symtpoms    Need tighter control    Would increase Crestor to 20   F/U lipids panel in 8 wks with LFTs ALso  Pt would like to try Viagra to see if helps   Call in Rx to [facility] drug

## 2019-09-23 ENCOUNTER — Telehealth: Payer: Self-pay | Admitting: Internal Medicine

## 2019-09-23 MED ORDER — TADALAFIL 20 MG PO TABS: 20.0000 mg | ORAL_TABLET | Freq: Every day | ORAL | 0 refills | Status: DC | PRN

## 2019-09-23 MED ORDER — SILDENAFIL CITRATE 50 MG PO TABS: ORAL_TABLET | ORAL | 0 refills | Status: DC

## 2019-09-23 MED ORDER — ROSUVASTATIN CALCIUM 20 MG PO TABS: 20.0000 mg | ORAL_TABLET | Freq: Every day | ORAL | 3 refills | Status: DC

## 2019-09-23 NOTE — Telephone Encounter (Signed)
Done, sent to [facility] as discussed with patient for cost effectiveness. Notified patient via [place].

## 2019-09-23 NOTE — Telephone Encounter (Signed)
Spoke with patient. He will increase Crestor to 20 mg daily and return in 8 weeks to check lipids/lfts.  Sildenafil 50 mg sent to [facility] (better cost).

## 2019-09-23 NOTE — Telephone Encounter (Signed)
Pt wrote in yesterday    Would like Rx for 10 Sildenafil 100 and 10 tadalafil 20 mg   [facility] in [place].    Please fill as requested

## 2019-09-23 NOTE — Addendum Note (Signed)
Addended by: Rodman Key on: 09/23/2019 09:44 AM   Modules accepted: Orders

## 2019-11-19 ENCOUNTER — Other Ambulatory Visit: Payer: Self-pay

## 2019-11-19 ENCOUNTER — Other Ambulatory Visit: Payer: Medicare Other | Admitting: *Deleted

## 2019-11-19 DIAGNOSIS — I2510 Atherosclerotic heart disease of native coronary artery without angina pectoris: Secondary | ICD-10-CM | POA: Diagnosis not present

## 2019-11-19 DIAGNOSIS — E782 Mixed hyperlipidemia: Secondary | ICD-10-CM | POA: Diagnosis not present

## 2019-11-19 LAB — LIPID PANEL
Chol/HDL Ratio: 2.2 ratio (ref 0.0–5.0)
Cholesterol, Total: 189 mg/dL (ref 100–199)
HDL: 87 mg/dL (ref >39)
LDL Chol Calc (NIH): 92 mg/dL (ref 0–99)
Triglycerides: 53 mg/dL (ref 0–149)
VLDL Cholesterol Cal: 10 mg/dL (ref 5–40)

## 2019-11-19 LAB — HEPATIC FUNCTION PANEL
ALT: 21 IU/L (ref 0–44)
AST: 24 IU/L (ref 0–40)
Albumin: 4.4 g/dL (ref 3.8–4.8)
Alkaline Phosphatase: 74 IU/L (ref 39–117)
Bilirubin Total: 1 mg/dL (ref 0.0–1.2)
Bilirubin, Direct: 0.22 mg/dL (ref 0.00–0.40)
Total Protein: 7.5 g/dL (ref 6.0–8.5)

## 2019-11-24 ENCOUNTER — Other Ambulatory Visit: Payer: Self-pay | Admitting: *Deleted

## 2019-11-24 DIAGNOSIS — E785 Hyperlipidemia, unspecified: Secondary | ICD-10-CM

## 2019-11-24 DIAGNOSIS — Z79899 Other long term (current) drug therapy: Secondary | ICD-10-CM

## 2019-11-24 MED ORDER — ROSUVASTATIN CALCIUM 10 MG PO TABS: 10.0000 mg | ORAL_TABLET | Freq: Every day | ORAL | 3 refills | Status: DC

## 2020-01-07 IMAGING — DX DG CHEST 1V PORT
1 series · 1 of 1 positions shown · non-contrast
Comparison: 09/26/2017

CLINICAL DATA: [id number] positive, cough

EXAM:
PORTABLE CHEST 1 VIEW

[chest ap]
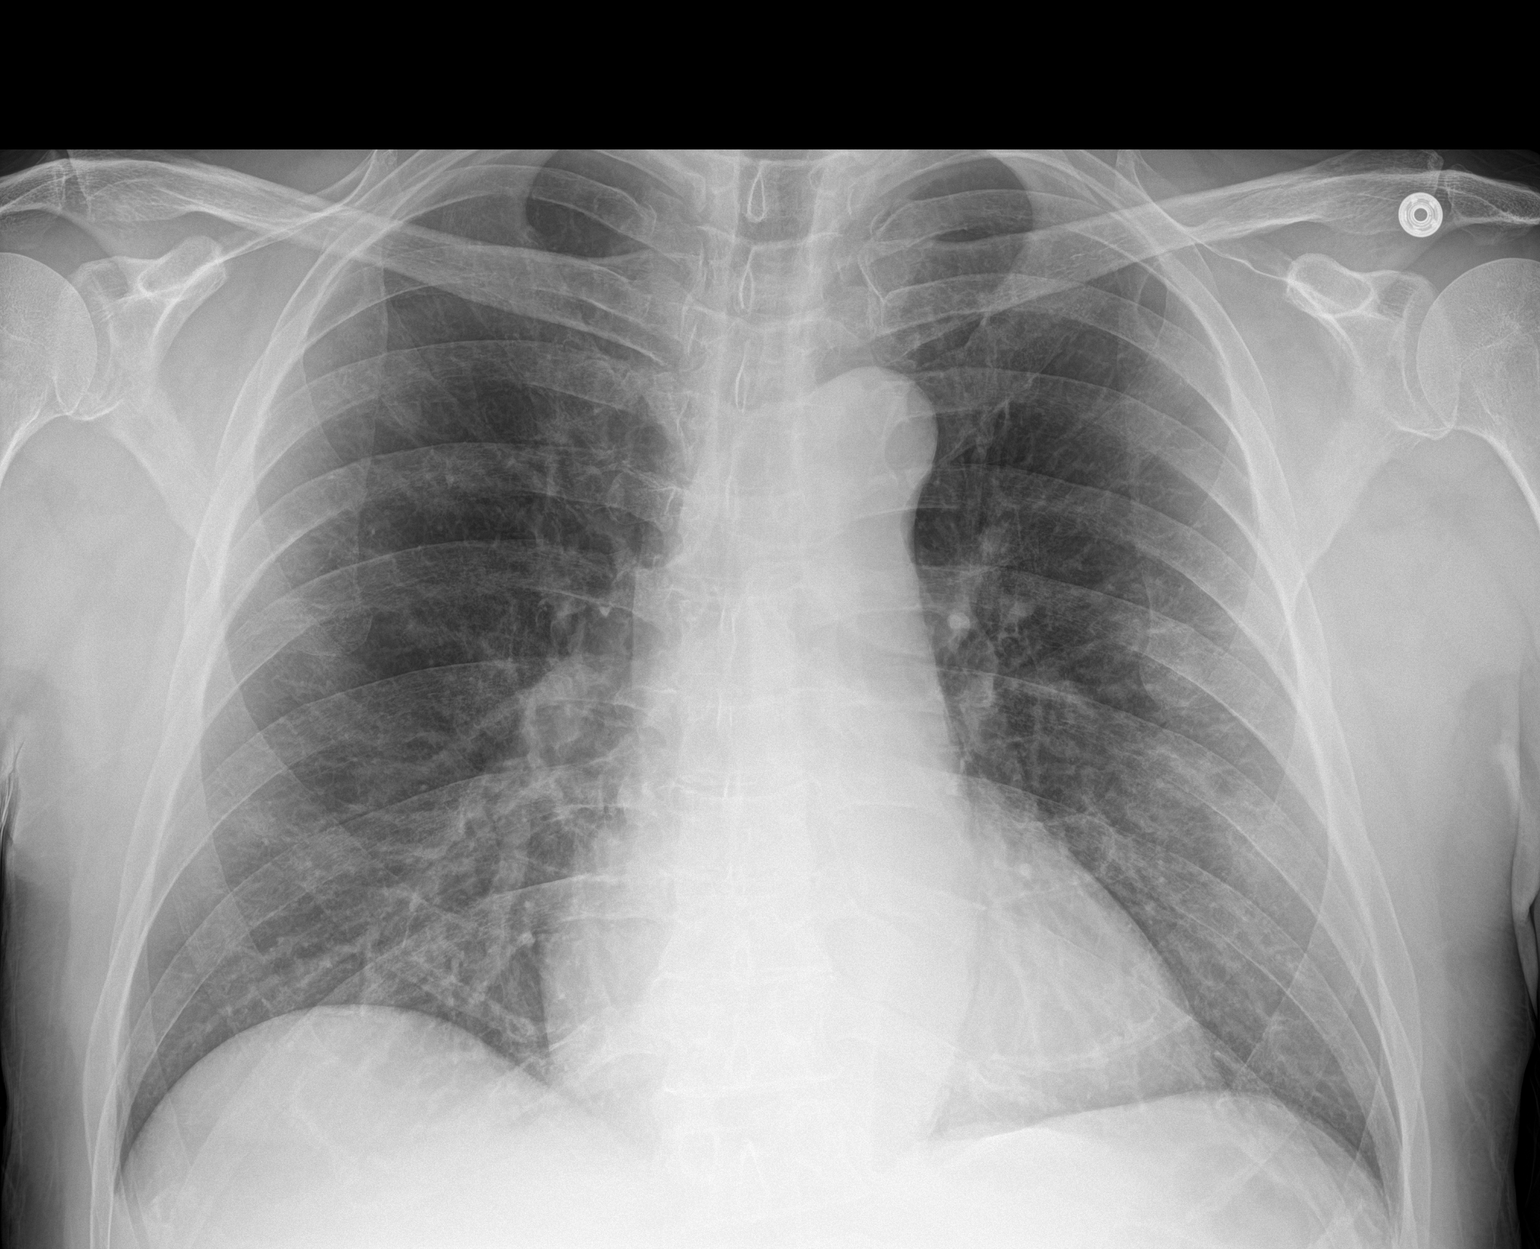

[1 of 1 positions shown; findings below may reference images not displayed]

FINDINGS: The heart size and mediastinal contours are within normal limits.
There are mild hazy airspace opacities within the peripheral aspect
of the mid to lower lung zones bilaterally. The visualized skeletal
structures are unremarkable.
IMPRESSION: Mild hazy airspace opacities in the peripheral aspect of the mid to
lower lung zones bilaterally suspicious for viral pneumonia given
patient history.

## 2020-01-25 ENCOUNTER — Other Ambulatory Visit: Payer: Medicare Other | Admitting: *Deleted

## 2020-01-25 ENCOUNTER — Other Ambulatory Visit: Payer: Self-pay

## 2020-01-25 DIAGNOSIS — E785 Hyperlipidemia, unspecified: Secondary | ICD-10-CM

## 2020-01-25 DIAGNOSIS — Z79899 Other long term (current) drug therapy: Secondary | ICD-10-CM

## 2020-01-25 LAB — LIPID PANEL
Chol/HDL Ratio: 2 ratio (ref 0.0–5.0)
Cholesterol, Total: 180 mg/dL (ref 100–199)
HDL: 90 mg/dL (ref >39)
LDL Chol Calc (NIH): 77 mg/dL (ref 0–99)
Triglycerides: 72 mg/dL (ref 0–149)
VLDL Cholesterol Cal: 13 mg/dL (ref 5–40)

## 2020-01-25 LAB — BASIC METABOLIC PANEL
BUN/Creatinine Ratio: 16 (ref 10–24)
BUN: 18 mg/dL (ref 8–27)
CO2: 20 mmol/L (ref 20–29)
Calcium: 9 mg/dL (ref 8.6–10.2)
Chloride: 105 mmol/L (ref 96–106)
Creatinine, Ser: 1.16 mg/dL (ref 0.76–1.27)
GFR calc Af Amer: 75 mL/min/{1.73_m2} (ref >59)
GFR calc non Af Amer: 65 mL/min/{1.73_m2} (ref >59)
Glucose: 96 mg/dL (ref 65–99)
Potassium: 4.1 mmol/L (ref 3.5–5.2)
Sodium: 141 mmol/L (ref 134–144)

## 2020-01-25 LAB — AST: AST: 24 IU/L (ref 0–40)

## 2020-02-10 NOTE — Telephone Encounter (Signed)
I sent msg that pt should increase Crestor to 20 mg in response to his lpids  The med list was not up to date   He actually is on 20 mg now. Therefore I would recomm increasing to 40 mg     F/U lipid panel in 8 wks    Goal is LDL lower than 70    (Europeans are getting goal now to 55; Definitely want below 70)

## 2020-02-11 ENCOUNTER — Other Ambulatory Visit: Payer: Self-pay | Admitting: *Deleted

## 2020-02-11 MED ORDER — ROSUVASTATIN CALCIUM 20 MG PO TABS: 20.0000 mg | ORAL_TABLET | Freq: Every day | ORAL | 3 refills | Status: DC

## 2020-02-11 NOTE — Progress Notes (Signed)
Med list updated. Patient currently taking Rosuvastatin 20 mg daily

## 2020-03-15 DIAGNOSIS — M7712 Lateral epicondylitis, left elbow: Secondary | ICD-10-CM | POA: Diagnosis not present

## 2020-03-15 DIAGNOSIS — M25522 Pain in left elbow: Secondary | ICD-10-CM | POA: Diagnosis not present

## 2020-03-15 DIAGNOSIS — M19011 Primary osteoarthritis, right shoulder: Secondary | ICD-10-CM | POA: Diagnosis not present

## 2020-03-21 DIAGNOSIS — H40013 Open angle with borderline findings, low risk, bilateral: Secondary | ICD-10-CM | POA: Diagnosis not present

## 2020-03-21 DIAGNOSIS — S46011S Strain of muscle(s) and tendon(s) of the rotator cuff of right shoulder, sequela: Secondary | ICD-10-CM | POA: Diagnosis not present

## 2020-03-21 DIAGNOSIS — S46212A Strain of muscle, fascia and tendon of other parts of biceps, left arm, initial encounter: Secondary | ICD-10-CM | POA: Diagnosis not present

## 2020-03-21 DIAGNOSIS — M25511 Pain in right shoulder: Secondary | ICD-10-CM | POA: Diagnosis not present

## 2020-03-23 DIAGNOSIS — S46212A Strain of muscle, fascia and tendon of other parts of biceps, left arm, initial encounter: Secondary | ICD-10-CM | POA: Diagnosis not present

## 2020-03-30 DIAGNOSIS — Z20828 Contact with and (suspected) exposure to other viral communicable diseases: Secondary | ICD-10-CM | POA: Diagnosis not present

## 2020-04-08 DIAGNOSIS — S46211A Strain of muscle, fascia and tendon of other parts of biceps, right arm, initial encounter: Secondary | ICD-10-CM | POA: Diagnosis not present

## 2020-04-08 DIAGNOSIS — M19011 Primary osteoarthritis, right shoulder: Secondary | ICD-10-CM | POA: Diagnosis not present

## 2020-04-08 DIAGNOSIS — S46011A Strain of muscle(s) and tendon(s) of the rotator cuff of right shoulder, initial encounter: Secondary | ICD-10-CM | POA: Diagnosis not present

## 2020-04-08 DIAGNOSIS — M75101 Unspecified rotator cuff tear or rupture of right shoulder, not specified as traumatic: Secondary | ICD-10-CM | POA: Diagnosis not present

## 2020-04-08 DIAGNOSIS — M7521 Bicipital tendinitis, right shoulder: Secondary | ICD-10-CM | POA: Diagnosis not present

## 2020-04-08 DIAGNOSIS — M7551 Bursitis of right shoulder: Secondary | ICD-10-CM | POA: Diagnosis not present

## 2020-04-18 DIAGNOSIS — S46011D Strain of muscle(s) and tendon(s) of the rotator cuff of right shoulder, subsequent encounter: Secondary | ICD-10-CM | POA: Diagnosis not present

## 2020-04-18 DIAGNOSIS — R030 Elevated blood-pressure reading, without diagnosis of hypertension: Secondary | ICD-10-CM | POA: Diagnosis not present

## 2020-04-18 DIAGNOSIS — S46212D Strain of muscle, fascia and tendon of other parts of biceps, left arm, subsequent encounter: Secondary | ICD-10-CM | POA: Diagnosis not present

## 2020-04-18 DIAGNOSIS — S46011S Strain of muscle(s) and tendon(s) of the rotator cuff of right shoulder, sequela: Secondary | ICD-10-CM | POA: Diagnosis not present

## 2020-04-18 DIAGNOSIS — E7800 Pure hypercholesterolemia, unspecified: Secondary | ICD-10-CM | POA: Diagnosis not present

## 2020-04-18 DIAGNOSIS — Z125 Encounter for screening for malignant neoplasm of prostate: Secondary | ICD-10-CM | POA: Diagnosis not present

## 2020-04-19 DIAGNOSIS — Z125 Encounter for screening for malignant neoplasm of prostate: Secondary | ICD-10-CM | POA: Diagnosis not present

## 2020-04-19 DIAGNOSIS — E039 Hypothyroidism, unspecified: Secondary | ICD-10-CM | POA: Diagnosis not present

## 2020-04-19 DIAGNOSIS — E7800 Pure hypercholesterolemia, unspecified: Secondary | ICD-10-CM | POA: Diagnosis not present

## 2020-04-19 DIAGNOSIS — R6882 Decreased libido: Secondary | ICD-10-CM | POA: Diagnosis not present

## 2020-04-19 DIAGNOSIS — R030 Elevated blood-pressure reading, without diagnosis of hypertension: Secondary | ICD-10-CM | POA: Diagnosis not present

## 2020-04-20 DIAGNOSIS — S46212S Strain of muscle, fascia and tendon of other parts of biceps, left arm, sequela: Secondary | ICD-10-CM | POA: Diagnosis not present

## 2020-04-25 ENCOUNTER — Other Ambulatory Visit: Payer: Self-pay | Admitting: Internal Medicine

## 2020-04-25 DIAGNOSIS — I10 Essential (primary) hypertension: Secondary | ICD-10-CM | POA: Diagnosis not present

## 2020-04-25 DIAGNOSIS — E7800 Pure hypercholesterolemia, unspecified: Secondary | ICD-10-CM | POA: Diagnosis not present

## 2020-04-25 DIAGNOSIS — I2510 Atherosclerotic heart disease of native coronary artery without angina pectoris: Secondary | ICD-10-CM | POA: Diagnosis not present

## 2020-04-25 DIAGNOSIS — K429 Umbilical hernia without obstruction or gangrene: Secondary | ICD-10-CM | POA: Diagnosis not present

## 2020-04-25 DIAGNOSIS — R200 Anesthesia of skin: Secondary | ICD-10-CM | POA: Diagnosis not present

## 2020-04-25 DIAGNOSIS — Z0000 Encounter for general adult medical examination without abnormal findings: Secondary | ICD-10-CM | POA: Diagnosis not present

## 2020-04-25 DIAGNOSIS — N4289 Other specified disorders of prostate: Secondary | ICD-10-CM | POA: Diagnosis not present

## 2020-04-25 DIAGNOSIS — Z23 Encounter for immunization: Secondary | ICD-10-CM | POA: Diagnosis not present

## 2020-04-25 DIAGNOSIS — Z1212 Encounter for screening for malignant neoplasm of rectum: Secondary | ICD-10-CM | POA: Diagnosis not present

## 2020-04-25 DIAGNOSIS — N529 Male erectile dysfunction, unspecified: Secondary | ICD-10-CM | POA: Diagnosis not present

## 2020-04-25 DIAGNOSIS — Z9889 Other specified postprocedural states: Secondary | ICD-10-CM | POA: Diagnosis not present

## 2020-04-25 DIAGNOSIS — Z8679 Personal history of other diseases of the circulatory system: Secondary | ICD-10-CM | POA: Diagnosis not present

## 2020-05-02 DIAGNOSIS — Z7409 Other reduced mobility: Secondary | ICD-10-CM | POA: Diagnosis not present

## 2020-05-02 DIAGNOSIS — Z789 Other specified health status: Secondary | ICD-10-CM | POA: Diagnosis not present

## 2020-05-02 DIAGNOSIS — M25511 Pain in right shoulder: Secondary | ICD-10-CM | POA: Diagnosis not present

## 2020-05-02 DIAGNOSIS — R29898 Other symptoms and signs involving the musculoskeletal system: Secondary | ICD-10-CM | POA: Diagnosis not present

## 2020-05-02 DIAGNOSIS — G8929 Other chronic pain: Secondary | ICD-10-CM | POA: Diagnosis not present

## 2020-05-02 DIAGNOSIS — S46011S Strain of muscle(s) and tendon(s) of the rotator cuff of right shoulder, sequela: Secondary | ICD-10-CM | POA: Diagnosis not present

## 2020-05-10 ENCOUNTER — Ambulatory Visit (INDEPENDENT_AMBULATORY_CARE_PROVIDER_SITE_OTHER): Payer: Medicare Other | Admitting: Podiatry

## 2020-05-10 ENCOUNTER — Encounter: Payer: Self-pay | Admitting: Podiatry

## 2020-05-10 ENCOUNTER — Other Ambulatory Visit: Payer: Self-pay

## 2020-05-10 ENCOUNTER — Ambulatory Visit (INDEPENDENT_AMBULATORY_CARE_PROVIDER_SITE_OTHER): Payer: Medicare Other

## 2020-05-10 DIAGNOSIS — I2510 Atherosclerotic heart disease of native coronary artery without angina pectoris: Secondary | ICD-10-CM

## 2020-05-10 DIAGNOSIS — G5793 Unspecified mononeuropathy of bilateral lower limbs: Secondary | ICD-10-CM | POA: Diagnosis not present

## 2020-05-10 DIAGNOSIS — M778 Other enthesopathies, not elsewhere classified: Secondary | ICD-10-CM

## 2020-05-11 NOTE — Progress Notes (Signed)
Subjective:  Patient ID: Patrick Ferguson, male    DOB: [birth year],  MRN: [id number] HPI Chief Complaint  Patient presents with  . Foot Pain    Plantar forefoot/toes bilateral - numbness x 3-4 months, walks a lot for exercise, did buy new dress shoes 6 months ago "only real change", PCP referred  . New Patient (Initial Visit)    64 y.o. male presents with the above complaint.   ROS: Denies fever chills nausea vomiting muscle aches pains calf pain back pain chest pain shortness of breath.  Past Medical History:  Diagnosis Date  . Hypertension    Past Surgical History:  Procedure Laterality Date  . A-FLUTTER ABLATION N/A 11/18/2017   Procedure: A-FLUTTER ABLATION;  Surgeon: Evans Lance, MD;  Location: [place] [facility];  Service: Cardiovascular;  Laterality: N/A;  . CARDIOVERSION N/A 10/10/2017   Procedure: CARDIOVERSION;  Surgeon: Larey Dresser, MD;  Location: [facility] ENDOSCOPY;  Service: Cardiovascular;  Laterality: N/A;  . TEE WITHOUT CARDIOVERSION N/A 10/10/2017   Procedure: TRANSESOPHAGEAL ECHOCARDIOGRAM (TEE);  Surgeon: Larey Dresser, MD;  Location: [facility] ENDOSCOPY;  Service: Cardiovascular;  Laterality: N/A;    Current Outpatient Medications:  .  amLODipine (NORVASC) 5 MG tablet, Take 1 tablet (5 mg total) by mouth in the morning and at bedtime., Disp: 180 tablet, Rfl: 3 .  Cholecalciferol (VITAMIN D3) 50 MCG (2000 UT) TABS, Take 4,000 Units by mouth daily., Disp: , Rfl:  .  rosuvastatin (CRESTOR) 20 MG tablet, Take 1 tablet (20 mg total) by mouth daily., Disp: 90 tablet, Rfl: 3 .  sildenafil (VIAGRA) 50 MG tablet, TAKE 1 - 2 TABLETS BY MOUTH DAILY AS NEEDED FOR ERECTILE DYSFUNCTION, Disp: 10 tablet, Rfl: 0 .  tadalafil (CIALIS) 20 MG tablet, TAKE ONE TABLET BY MOUTH DAILY AS NEEDED for ed, Disp: 10 tablet, Rfl: 0 .  UNABLE TO FIND, Take 1 tablet by mouth daily. Ageless male, Disp: , Rfl:   Allergies  Allergen Reactions  . Sulfites Other (See Comments)    Uvula gets extended     Review of Systems Objective:  There were no vitals filed for this visit.  General: Well developed, nourished, in no acute distress, alert and oriented x3   Dermatological: Skin is warm, dry and supple bilateral. Nails x 10 are well maintained; remaining integument appears unremarkable at this time. There are no open sores, no preulcerative lesions, no rash or signs of infection present.  Vascular: Dorsalis Pedis artery and Posterior Tibial artery pedal pulses are 2/4 bilateral with immedate capillary fill time. Pedal hair growth present. No varicosities and no lower extremity edema present bilateral.   Neruologic: Grossly intact via light touch bilateral. Vibratory intact via tuning fork bilateral. Protective threshold with Semmes Wienstein monofilament intact to all pedal sites bilateral. Patellar and Achilles deep tendon reflexes 2+ bilateral. No Babinski or clonus noted bilateral.   Musculoskeletal: No gross boney pedal deformities bilateral. No pain, crepitus, or limitation noted with foot and ankle range of motion bilateral. Muscular strength 5/5 in all groups tested bilateral.  No reproducible pain on evaluation today mild fat pad atrophy forefoot.  Gait: Unassisted, Nonantalgic.    Radiographs:  Radiographs taken today demonstrate an osseously mature individual with mild metatarsus adductus left greater than right delta phalanx second digit bilateral.  No spurring is noted.  No hammertoe development.  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     Hydia Copelin T. [place], [place]

## 2020-05-28 DIAGNOSIS — Z23 Encounter for immunization: Secondary | ICD-10-CM | POA: Diagnosis not present

## 2020-06-01 DIAGNOSIS — S46212D Strain of muscle, fascia and tendon of other parts of biceps, left arm, subsequent encounter: Secondary | ICD-10-CM | POA: Diagnosis not present

## 2020-08-25 DIAGNOSIS — H43393 Other vitreous opacities, bilateral: Secondary | ICD-10-CM | POA: Diagnosis not present

## 2020-08-25 DIAGNOSIS — H40013 Open angle with borderline findings, low risk, bilateral: Secondary | ICD-10-CM | POA: Diagnosis not present

## 2020-08-25 DIAGNOSIS — H353132 Nonexudative age-related macular degeneration, bilateral, intermediate dry stage: Secondary | ICD-10-CM | POA: Diagnosis not present

## 2020-08-25 DIAGNOSIS — H43811 Vitreous degeneration, right eye: Secondary | ICD-10-CM | POA: Diagnosis not present

## 2020-08-25 NOTE — Progress Notes (Signed)
Cardiology Office Note   Date:  08/26/2020   ID:  Patrick Ferguson, DOB [birth year], MRN [id number]  PCP:  Deland Pretty, MD  Cardiologist:   Dorris Carnes, MD   Pt presents for f/u of HTN    History of Present Illness: Patrick Ferguson is a 65 y.o. male with a history of HTN and atrial flutter.  LVEF 30% when diagnosed   Underwent TEE cardioverison with recurrence.   In April 2019 underwent ablation.   REpeat echo after  LVEF 50 to 55%  Pt had episode of heart pounding in the past  Wore an event monitor   Only occaisonal PVCs, short burst PAT seen  Resolved  I last saw the pt in a televisit in Feb 2021  Since seen he has done OK  Notes occasional heart pounding  Very short, limited He denies SOB  No diizziness  No CP  Has some numbness in balls of feet  Seen by podiatry   Nothing found  Current Meds  Medication Sig   amLODipine (NORVASC) 5 MG tablet Take 1 tablet (5 mg total) by mouth in the morning and at bedtime.   B Complex Vitamins (B COMPLEX PO) Take by mouth daily.   Cholecalciferol (VITAMIN D3) 50 MCG (2000 UT) TABS Take 4,000 Units by mouth daily.   rosuvastatin (CRESTOR) 20 MG tablet Take 1 tablet (20 mg total) by mouth daily.   sildenafil (VIAGRA) 50 MG tablet TAKE 1 - 2 TABLETS BY MOUTH DAILY AS NEEDED FOR ERECTILE DYSFUNCTION   tadalafil (CIALIS) 20 MG tablet TAKE ONE TABLET BY MOUTH DAILY AS NEEDED for ed   UNABLE TO FIND Take 1 tablet by mouth daily. Ageless male     Allergies:   Sulfites   Past Medical History:  Diagnosis Date   Hypertension     Past Surgical History:  Procedure Laterality Date   A-FLUTTER ABLATION N/A 11/18/2017   Procedure: A-FLUTTER ABLATION;  Surgeon: Evans Lance, MD;  Location: [place] [facility] LAB;  Service: Cardiovascular;  Laterality: N/A;   CARDIOVERSION N/A 10/10/2017   Procedure: CARDIOVERSION;  Surgeon: Larey Dresser, MD;  Location: [facility] ENDOSCOPY;  Service: Cardiovascular;  Laterality: N/A;   TEE WITHOUT CARDIOVERSION  N/A 10/10/2017   Procedure: TRANSESOPHAGEAL ECHOCARDIOGRAM (TEE);  Surgeon: Larey Dresser, MD;  Location: [facility] ENDOSCOPY;  Service: Cardiovascular;  Laterality: N/A;     Social History:  The patient  reports that he has never smoked. He has never used smokeless tobacco. He reports that he does not drink alcohol and does not use drugs.   Family History:  The patient's family history includes Heart disease in his father.    ROS:  Please see the history of present illness. All other systems are reviewed and  Negative to the above problem except as noted.    PHYSICAL EXAM: VS:  BP 140/84    Pulse 70    Ht 5\' 11"  (1.803 m)    Wt 193 lb (87.5 kg)    SpO2 99%    BMI 26.92 kg/m   GEN: Pt is  in no acute distress  HEENT: normal  Neck: JVP normal   No carotid bruits Cardiac: RRR  Frequent skips   S1, S2   ,no LE  edema  Respiratory:  clear to auscultation bilaterally, normal work of breathing GI: soft, nontender, nondistended, + BS  No hepatomegaly  MS: no deformity Moving all extremities   Skin: warm and dry, no rash Neuro:  Strength and sensation  are intact Psych: euthymic mood, full affect   EKG:  EKG is not ordered today    In Sept 2021 EKG showed SR with PVCs   Lipid Panel    Component Value Date/Time   CHOL 180 01/25/2020 1002   TRIG 72 01/25/2020 1002   HDL 90 01/25/2020 1002   CHOLHDL 2.0 01/25/2020 1002   LDLCALC 77 01/25/2020 1002      Wt Readings from Last 3 Encounters:  08/26/20 193 lb (87.5 kg)  03/09/19 185 lb (83.9 kg)  12/15/18 183 lb (83 kg)      ASSESSMENT AND PLAN:  1  Atrial flutter Pt is s/p ablation   Currently without symptoms  Occasional palpitations   2  PVCs  EKG with frequent PVCs    Today on exam has skips frequent  He does not sense them    Will set up for 48 hour monitor to determine overall burden  3  Hx systolic CHF  Most likely tachy induced   LVEF  normalized on last echo  3  CAD  Pt with evid of CAD with calcifications of LAD on CT  scan in past  No symptoms   Rx lipids   Diet   Walk  4  Hx HTN  Not optimal  He admits to only taking 5 amlodipine   Will increase to 10 mg per day  Follow at home   5  Lipids  On Crestor 20   Excellent control of lipids      Plan for follow up in September   I have asked pt to send in BP readings via My Chart.   F/U with pt on monitor results    Current medicines are reviewed at length with the patient today.  The patient does not have concerns regarding medicines.  Signed, Dorris Carnes, MD  08/26/2020 8:32 AM    [address], [place], [place]  [address] Phone: [phone]; Fax: [phone]

## 2020-08-26 ENCOUNTER — Encounter: Payer: Self-pay | Admitting: Internal Medicine

## 2020-08-26 ENCOUNTER — Ambulatory Visit (INDEPENDENT_AMBULATORY_CARE_PROVIDER_SITE_OTHER): Payer: Medicare Other

## 2020-08-26 ENCOUNTER — Encounter: Payer: Self-pay | Admitting: Radiology

## 2020-08-26 ENCOUNTER — Other Ambulatory Visit: Payer: Self-pay

## 2020-08-26 ENCOUNTER — Ambulatory Visit (INDEPENDENT_AMBULATORY_CARE_PROVIDER_SITE_OTHER): Payer: Medicare Other | Admitting: Internal Medicine

## 2020-08-26 VITALS — BP 140/84 | HR 70 | Ht 71.0 in | Wt 193.0 lb

## 2020-08-26 DIAGNOSIS — I4891 Unspecified atrial fibrillation: Secondary | ICD-10-CM

## 2020-08-26 MED ORDER — AMLODIPINE BESYLATE 5 MG PO TABS
10.0000 mg | ORAL_TABLET | Freq: Every day | ORAL | 3 refills | Status: DC
Start: 2020-08-26 — End: 2021-11-09

## 2020-08-26 NOTE — Progress Notes (Signed)
Enrolled patient for a 3 day Zio XT Monitor to be mailed to patients home.  °

## 2020-08-26 NOTE — Patient Instructions (Signed)
Medication Instructions:  Take amlodipine 5 mg --take 2 tablets every morning.  *If you need a refill on your cardiac medications before your next appointment, please call your pharmacy*   Lab Work: none If you have labs (blood work) drawn today and your tests are completely normal, you will receive your results only by:  [place] (if you have MyChart) OR  A paper copy in the mail If you have any lab test that is abnormal or we need to change your treatment, we will call you to review the results.   Testing/Procedures: [place]- Long Term Monitor Instructions   Your physician has requested you wear your ZIO patch monitor___3___days.   This is a single patch monitor.  [employer] supplies one patch monitor per enrollment.  Additional stickers are not available.   Please do not apply patch if you will be having a Nuclear Stress Test, Echocardiogram, Cardiac CT, MRI, or Chest Xray during the time frame you would be wearing the monitor. The patch cannot be worn during these tests.  You cannot remove and re-apply the ZIO XT patch monitor.   Your ZIO patch monitor will be sent USPS Priority mail from [facility] directly to your home address. The monitor may also be mailed to a PO BOX if home delivery is not available.   It may take 3-5 days to receive your monitor after you have been enrolled.   Once you have received you monitor, please review enclosed instructions.  Your monitor has already been registered assigning a specific monitor serial # to you.   Applying the monitor   Shave hair from upper left chest.   Hold abrader disc by orange tab.  Rub abrader in 40 strokes over left upper chest as indicated in your monitor instructions.   Clean area with 4 enclosed alcohol pads .  Use all pads to assure are is cleaned thoroughly.  Let dry.   Apply patch as indicated in monitor instructions.  Patch will be place under collarbone on left side of chest with arrow pointing  upward.   Rub patch adhesive wings for 2 minutes.Remove white label marked "1".  Remove white label marked "2".  Rub patch adhesive wings for 2 additional minutes.   While looking in a mirror, press and release button in center of patch.  A small green light will flash 3-4 times .  This will be your only indicator the monitor has been turned on.     Do not shower for the first 24 hours.  You may shower after the first 24 hours.   Press button if you feel a symptom. You will hear a small click.  Record Date, Time and Symptom in the Patient Log Book.   When you are ready to remove patch, follow instructions on last 2 pages of Patient Log Book.  Stick patch monitor onto last page of Patient Log Book.   Place Patient Log Book in [place] box.  Use locking tab on box and tape box closed securely.  The [employer] and [employer] has [employer] on it.  Please place in mailbox as soon as possible.  Your physician should have your test results approximately 7 days after the monitor has been mailed back to [facility].   Call [place] at [phone] if you have questions regarding your ZIO XT patch monitor.  Call them immediately if you see an orange light blinking on your monitor.   If your monitor falls off in less  than 4 days contact our Monitor department at [phone].  If your monitor becomes loose or falls off after 4 days call [employer] at [phone] for suggestions on securing your monitor.     Follow-Up: At [facility], you and your health needs are our priority.  As part of our continuing mission to provide you with exceptional heart care, we have created designated Provider Care Teams.  These Care Teams include your primary Cardiologist (physician) and Advanced Practice Providers (APPs -  Physician Assistants and Nurse Practitioners) who all work together to provide you with the care you need, when you need it.   Your next appointment:   9 month(s)  The format  for your next appointment:   In Person  Provider:   Dorris Carnes, MD   Other Instructions

## 2020-09-16 DIAGNOSIS — I4891 Unspecified atrial fibrillation: Secondary | ICD-10-CM | POA: Diagnosis not present

## 2020-10-13 ENCOUNTER — Other Ambulatory Visit: Payer: Self-pay | Admitting: Internal Medicine

## 2020-10-14 ENCOUNTER — Telehealth: Payer: Self-pay | Admitting: Internal Medicine

## 2020-10-14 NOTE — Telephone Encounter (Signed)
Patient returning call for monitor results. Aware Michalene is off this afternoon and OK to wait til Monday for results if need be.

## 2020-10-14 NOTE — Telephone Encounter (Signed)
Spoke with pt and advised per Dr Harrington Challenger monitor shows:  Monitor shows SR  There are frequent PVCs (13% total)  REviewed with EP  WOuld try Toprol XL  25 mg to start  Follow  REpeat monitor in 3 months    Pt advised per Dr Alan Ripper RN, pt was taking Metoprolol which was stopped 09/20/2020 and she will need to confirm with Dr Harrington Challenger if medication needs to be started. Pt states he would prefer not to be on additional medication unless it is absolutely needed.  Will forward information to Dr Harrington Challenger and her RN.  Pt verbalizes understanding and agrees with current plan.

## 2020-10-17 MED ORDER — METOPROLOL SUCCINATE ER 25 MG PO TB24: 25.0000 mg | ORAL_TABLET | Freq: Every day | ORAL | 3 refills | Status: DC

## 2020-10-17 NOTE — Telephone Encounter (Signed)
Spoke with patient.  Notified that Dr. Harrington Challenger recommends that he start Toprol XL for PVCs and that we will repeat another monitor in about 3 months.

## 2020-10-22 DIAGNOSIS — Z20822 Contact with and (suspected) exposure to covid-19: Secondary | ICD-10-CM | POA: Diagnosis not present

## 2021-03-02 DIAGNOSIS — H40013 Open angle with borderline findings, low risk, bilateral: Secondary | ICD-10-CM | POA: Diagnosis not present

## 2021-04-24 DIAGNOSIS — E7800 Pure hypercholesterolemia, unspecified: Secondary | ICD-10-CM | POA: Diagnosis not present

## 2021-04-24 DIAGNOSIS — R030 Elevated blood-pressure reading, without diagnosis of hypertension: Secondary | ICD-10-CM | POA: Diagnosis not present

## 2021-04-24 DIAGNOSIS — Z125 Encounter for screening for malignant neoplasm of prostate: Secondary | ICD-10-CM | POA: Diagnosis not present

## 2021-05-02 DIAGNOSIS — Z23 Encounter for immunization: Secondary | ICD-10-CM | POA: Diagnosis not present

## 2021-06-27 DIAGNOSIS — E7800 Pure hypercholesterolemia, unspecified: Secondary | ICD-10-CM | POA: Diagnosis not present

## 2021-06-27 DIAGNOSIS — Z0000 Encounter for general adult medical examination without abnormal findings: Secondary | ICD-10-CM | POA: Diagnosis not present

## 2021-06-27 DIAGNOSIS — Z23 Encounter for immunization: Secondary | ICD-10-CM | POA: Diagnosis not present

## 2021-06-27 DIAGNOSIS — I10 Essential (primary) hypertension: Secondary | ICD-10-CM | POA: Diagnosis not present

## 2021-08-17 DIAGNOSIS — H0288A Meibomian gland dysfunction right eye, upper and lower eyelids: Secondary | ICD-10-CM | POA: Diagnosis not present

## 2021-08-17 DIAGNOSIS — H04123 Dry eye syndrome of bilateral lacrimal glands: Secondary | ICD-10-CM | POA: Diagnosis not present

## 2021-08-17 DIAGNOSIS — H00022 Hordeolum internum right lower eyelid: Secondary | ICD-10-CM | POA: Diagnosis not present

## 2021-08-17 DIAGNOSIS — H0288B Meibomian gland dysfunction left eye, upper and lower eyelids: Secondary | ICD-10-CM | POA: Diagnosis not present

## 2021-08-29 ENCOUNTER — Ambulatory Visit (INDEPENDENT_AMBULATORY_CARE_PROVIDER_SITE_OTHER): Payer: Medicare Other

## 2021-08-29 ENCOUNTER — Encounter: Payer: Self-pay | Admitting: Internal Medicine

## 2021-08-29 ENCOUNTER — Other Ambulatory Visit: Payer: Self-pay

## 2021-08-29 ENCOUNTER — Ambulatory Visit (INDEPENDENT_AMBULATORY_CARE_PROVIDER_SITE_OTHER): Payer: Medicare Other | Admitting: Internal Medicine

## 2021-08-29 VITALS — BP 124/80 | HR 78 | Ht 72.0 in | Wt 203.6 lb

## 2021-08-29 DIAGNOSIS — E782 Mixed hyperlipidemia: Secondary | ICD-10-CM

## 2021-08-29 DIAGNOSIS — I493 Ventricular premature depolarization: Secondary | ICD-10-CM | POA: Diagnosis not present

## 2021-08-29 DIAGNOSIS — E785 Hyperlipidemia, unspecified: Secondary | ICD-10-CM

## 2021-08-29 DIAGNOSIS — I4891 Unspecified atrial fibrillation: Secondary | ICD-10-CM

## 2021-08-29 DIAGNOSIS — Z79899 Other long term (current) drug therapy: Secondary | ICD-10-CM | POA: Diagnosis not present

## 2021-08-29 NOTE — Progress Notes (Signed)
Cardiology Office Note   Date:  08/29/2021   ID:  Patrick Ferguson, DOB [birth year], MRN [id number]  PCP:  Deland Pretty, MD  Cardiologist:   Dorris Carnes, MD   Pt presents for f/u of HTN    History of Present Illness: Patrick Ferguson is a 66 y.o. male with a history of HTN and atrial flutter.  LVEF 30% when diagnosed   Underwent TEE cardioverison with recurrence.   In April 2019 underwent ablation.   REpeat echo after  LVEF 50 to 55%  Pt had episode of heart pounding in the past  Wore an event monitor   Only occaisonal PVCs, short burst PAT seen  Resolved  I last saw the pt in  Feb 2022. EKG had PVCs   Exam had frequent skps  I recomm a monitor   48 hour monitor showed 13% PVCs   Recomm Toprol XL 25   F/U montor ordered but not done  Since seen he has been doing OK   Denies palpitations   No CP   No SOB  No dizziness    Fairly active   Current Meds  Medication Sig   amLODipine (NORVASC) 5 MG tablet Take 2 tablets (10 mg total) by mouth daily.   B Complex Vitamins (B COMPLEX PO) Take by mouth daily.   Cholecalciferol (VITAMIN D3) 50 MCG (2000 UT) TABS Take 4,000 Units by mouth daily.   metoprolol succinate (TOPROL XL) 25 MG 24 hr tablet Take 1 tablet (25 mg total) by mouth daily.   rosuvastatin (CRESTOR) 20 MG tablet TAKE ONE TABLET BY MOUTH DAILY   sildenafil (VIAGRA) 50 MG tablet TAKE 1 - 2 TABLETS BY MOUTH DAILY AS NEEDED FOR ERECTILE DYSFUNCTION   tadalafil (CIALIS) 20 MG tablet TAKE ONE TABLET BY MOUTH DAILY AS NEEDED for ed   UNABLE TO FIND Take 1 tablet by mouth daily. Ageless male     Allergies:   Sulfites   Past Medical History:  Diagnosis Date   Hypertension     Past Surgical History:  Procedure Laterality Date   A-FLUTTER ABLATION N/A 11/18/2017   Procedure: A-FLUTTER ABLATION;  Surgeon: Evans Lance, MD;  Location: [place] [facility];  Service: Cardiovascular;  Laterality: N/A;   CARDIOVERSION N/A 10/10/2017   Procedure: CARDIOVERSION;  Surgeon: Larey Dresser,  MD;  Location: [facility] ENDOSCOPY;  Service: Cardiovascular;  Laterality: N/A;   TEE WITHOUT CARDIOVERSION N/A 10/10/2017   Procedure: TRANSESOPHAGEAL ECHOCARDIOGRAM (TEE);  Surgeon: Larey Dresser, MD;  Location: [facility] ENDOSCOPY;  Service: Cardiovascular;  Laterality: N/A;     Social History:  The patient  reports that he has never smoked. He has never used smokeless tobacco. He reports that he does not drink alcohol and does not use drugs.   Family History:  The patient's family history includes Heart disease in his father.    ROS:  Please see the history of present illness. All other systems are reviewed and  Negative to the above problem except as noted.    PHYSICAL EXAM: VS:  BP 124/80    Pulse 78    Ht 6' (1.829 m)    Wt 203 lb 9.6 oz (92.4 kg)    SpO2 97%    BMI 27.61 kg/m   GEN: Pt is  in no acute distress  HEENT: normal  Neck: JVP normal   No carotid bruits Cardiac: RRR .  S1, S2.  No Murmurs   No LE  edema  Respiratory:  clear to  auscultation bilaterally GI: soft, nontender, nondistended, + BS  No hepatomegaly  MS: no deformity Moving all extremities   Skin: warm and dry, no rash Neuro:  Strength and sensation are intact Psych: euthymic mood, full affect   EKG:  EKG shows NSR 78 bpm   PVCs     Lipid Panel    Component Value Date/Time   CHOL 180 01/25/2020 1002   TRIG 72 01/25/2020 1002   HDL 90 01/25/2020 1002   CHOLHDL 2.0 01/25/2020 1002   LDLCALC 77 01/25/2020 1002      Wt Readings from Last 3 Encounters:  08/29/21 203 lb 9.6 oz (92.4 kg)  08/26/20 193 lb (87.5 kg)  03/09/19 185 lb (83.9 kg)      ASSESSMENT AND PLAN:  1  PVCs   Pt had 13% burden on monitor 1  year ago  Never started b blocker  He does not sense anything    Today has PVCs on EKG    I would recomm repeat monitor to reestablish burden before treating   2  Atrial flutter Pt is s/p ablation   Currently without symptoms    3  Hx systolic CHF  Most likely tachy induced   LVEF  normalized after  ablation   3  CAD  Pt with evid of CAD with calcifications of LAD on CT from 2019   Pt remains asymptmoatic   Rx lipids   Diet   Walk  4  Hx HTNe   BP is good on current regimen   COntinue  5  Lipids  On Crestor 20   Last lipoids LDL 89  HDL 82  Trig 52   Numbers were better before   These are from September   I would recomm sched lipomed, Lpa and ApoB        Discussed diet  Low carb, low added sugar , TRE  Plan for follow up in clinic in 1 year   Will be in touch with pt re lab results      Current medicines are reviewed at length with the patient today.  The patient does not have concerns regarding medicines.  Signed, Dorris Carnes, MD  08/29/2021 3:14 PM    [address], [place], [place]  [address] Phone: [phone]; Fax: [phone]

## 2021-08-29 NOTE — Progress Notes (Unsigned)
Enrolled for [employer] to mail a ZIO XT long term holter monitor to the patients address on file.  

## 2021-08-29 NOTE — Patient Instructions (Signed)
Medication Instructions:  Your physician recommends that you continue on your current medications as directed. Please refer to the Current Medication list given to you today.  *If you need a refill on your cardiac medications before your next appointment, please call your pharmacy*   Lab Work: [place]... fasting  If you have labs (blood work) drawn today and your tests are completely normal, you will receive your results only by: [place] (if you have MyChart) OR A paper copy in the mail If you have any lab test that is abnormal or we need to change your treatment, we will call you to review the results.   Testing/Procedures: [place]- Long Term Monitor Instructions  Your physician has requested you wear a ZIO patch monitor for 3 days.  This is a single patch monitor. [employer] supplies one patch monitor per enrollment. Additional stickers are not available. Please do not apply patch if you will be having a Nuclear Stress Test,  Echocardiogram, Cardiac CT, MRI, or Chest Xray during the period you would be wearing the  monitor. The patch cannot be worn during these tests. You cannot remove and re-apply the  ZIO XT patch monitor.  Your ZIO patch monitor will be mailed 3 day USPS to your address on file. It may take 3-5 days  to receive your monitor after you have been enrolled.  Once you have received your monitor, please review the enclosed instructions. Your monitor  has already been registered assigning a specific monitor serial # to you.  Billing and Patient Assistance Program Information  We have supplied [employer] with any of your insurance information on file for billing purposes. [employer] offers a sliding scale Patient Assistance Program for patients that do not have  insurance, or whose insurance does not completely cover the cost of the ZIO monitor.  You must apply for the Patient Assistance Program to qualify for this discounted rate.  To apply, please call [employer] at  [phone], select option 4, select option 2, ask to apply for  Patient Assistance Program. Theodore Demark will ask your household income, and how many people  are in your household. They will quote your out-of-pocket cost based on that information.  [employer] will also be able to set up a 85-month, interest-free payment plan if needed.  Applying the monitor   Shave hair from upper left chest.  Hold abrader disc by orange tab. Rub abrader in 40 strokes over the upper left chest as  indicated in your monitor instructions.  Clean area with 4 enclosed alcohol pads. Let dry.  Apply patch as indicated in monitor instructions. Patch will be placed under collarbone on left  side of chest with arrow pointing upward.  Rub patch adhesive wings for 2 minutes. Remove white label marked "1". Remove the white  label marked "2". Rub patch adhesive wings for 2 additional minutes.  While looking in a mirror, press and release button in center of patch. A small green light will  flash 3-4 times. This will be your only indicator that the monitor has been turned on.  Do not shower for the first 24 hours. You may shower after the first 24 hours.  Press the button if you feel a symptom. You will hear a small click. Record Date, Time and  Symptom in the Patient Logbook.  When you are ready to remove the patch, follow instructions on the last 2 pages of Patient  Logbook. Stick patch monitor onto the last page of Patient Logbook.  Place Patient  Logbook in the blue and white box. Use locking tab on box and tape box closed  securely. The blue and white box has prepaid postage on it. Please place it in the mailbox as  soon as possible. Your physician should have your test results approximately 7 days after the  monitor has been mailed back to [facility]  Call [place] at [phone] if you have questions regarding  your ZIO XT patch monitor. Call them immediately if you see an orange light  blinking on your  monitor.  If your monitor falls off in less than 4 days, contact our Monitor department at [phone].  If your monitor becomes loose or falls off after 4 days call [employer] at [phone] for  suggestions on securing your monitor    Follow-Up: At [facility], you and your health needs are our priority.  As part of our continuing mission to provide you with exceptional heart care, we have created designated Provider Care Teams.  These Care Teams include your primary Cardiologist (physician) and Advanced Practice Providers (APPs -  Physician Assistants and Nurse Practitioners) who all work together to provide you with the care you need, when you need it.  We recommend signing up for the patient portal called "MyChart".  Sign up information is provided on this After Visit Summary.  MyChart is used to connect with patients for Virtual Visits (Telemedicine).  Patients are able to view lab/test results, encounter notes, upcoming appointments, etc.  Non-urgent messages can be sent to your provider as well.   To learn more about what you can do with MyChart, go to [email or web address].    Your next appointment:   1 year(s)  The format for your next appointment:   In Person  Provider:   Dorris Carnes, MD     Other Instructions

## 2021-09-06 DIAGNOSIS — E785 Hyperlipidemia, unspecified: Secondary | ICD-10-CM | POA: Diagnosis not present

## 2021-09-06 DIAGNOSIS — Z79899 Other long term (current) drug therapy: Secondary | ICD-10-CM

## 2021-09-06 DIAGNOSIS — I4891 Unspecified atrial fibrillation: Secondary | ICD-10-CM

## 2021-09-06 DIAGNOSIS — I493 Ventricular premature depolarization: Secondary | ICD-10-CM | POA: Diagnosis not present

## 2021-09-06 DIAGNOSIS — E782 Mixed hyperlipidemia: Secondary | ICD-10-CM

## 2021-09-13 DIAGNOSIS — E782 Mixed hyperlipidemia: Secondary | ICD-10-CM | POA: Diagnosis not present

## 2021-09-13 DIAGNOSIS — I4891 Unspecified atrial fibrillation: Secondary | ICD-10-CM | POA: Diagnosis not present

## 2021-09-13 DIAGNOSIS — E785 Hyperlipidemia, unspecified: Secondary | ICD-10-CM | POA: Diagnosis not present

## 2021-09-14 DIAGNOSIS — H353132 Nonexudative age-related macular degeneration, bilateral, intermediate dry stage: Secondary | ICD-10-CM | POA: Diagnosis not present

## 2021-09-14 DIAGNOSIS — H43393 Other vitreous opacities, bilateral: Secondary | ICD-10-CM | POA: Diagnosis not present

## 2021-09-14 DIAGNOSIS — H43811 Vitreous degeneration, right eye: Secondary | ICD-10-CM | POA: Diagnosis not present

## 2021-09-14 DIAGNOSIS — H40013 Open angle with borderline findings, low risk, bilateral: Secondary | ICD-10-CM | POA: Diagnosis not present

## 2021-09-15 ENCOUNTER — Other Ambulatory Visit: Payer: Medicare Other | Admitting: *Deleted

## 2021-09-15 ENCOUNTER — Other Ambulatory Visit: Payer: Self-pay

## 2021-09-15 DIAGNOSIS — E782 Mixed hyperlipidemia: Secondary | ICD-10-CM

## 2021-09-15 DIAGNOSIS — I4891 Unspecified atrial fibrillation: Secondary | ICD-10-CM | POA: Diagnosis not present

## 2021-09-15 DIAGNOSIS — E785 Hyperlipidemia, unspecified: Secondary | ICD-10-CM

## 2021-09-15 DIAGNOSIS — I493 Ventricular premature depolarization: Secondary | ICD-10-CM

## 2021-09-15 DIAGNOSIS — Z79899 Other long term (current) drug therapy: Secondary | ICD-10-CM | POA: Diagnosis not present

## 2021-09-16 LAB — APOLIPOPROTEIN B: Apolipoprotein B: 74 mg/dL (ref <90)

## 2021-09-16 LAB — NMR, LIPOPROFILE
Cholesterol, Total: 197 mg/dL (ref 100–199)
HDL Particle Number: 41.3 umol/L (ref >=30.5)
HDL-C: 88 mg/dL (ref >39)
LDL Particle Number: 884 nmol/L (ref <1000)
LDL Size: 21.6 nm (ref >20.5)
LDL-C (NIH Calc): 94 mg/dL (ref 0–99)
LP-IR Score: <25 (ref <=45)
Small LDL Particle Number: <90 nmol/L (ref <=527)
Triglycerides: 86 mg/dL (ref 0–149)

## 2021-09-16 LAB — LIPOPROTEIN A (LPA): Lipoprotein (a): 12.6 nmol/L (ref <75.0)

## 2021-09-19 ENCOUNTER — Telehealth: Payer: Self-pay

## 2021-09-19 NOTE — Telephone Encounter (Signed)
Spoke with the pt re: his results but it was hard to hear him and for him to hear me due to him being at a dinner in the [place]... I advised that I will send everything to his My Chart and he says he will review and call me back ina few days when he is in a better time and place to talk with me.

## 2021-09-19 NOTE — Telephone Encounter (Signed)
-----   Message from [place], MD sent at 09/17/2021 10:26 PM EST ----- Monitor shows SR   Rates 14 to 130 bpm   Average HR 75 bpm Frequent PVCs (19.8% total)     I woiuld recomm he start Toprol XL 25 mg

## 2021-10-13 ENCOUNTER — Encounter: Payer: Self-pay | Admitting: Gastroenterology

## 2021-10-15 ENCOUNTER — Other Ambulatory Visit: Payer: Self-pay | Admitting: Internal Medicine

## 2021-11-09 ENCOUNTER — Ambulatory Visit (AMBULATORY_SURGERY_CENTER): Payer: Medicare Other | Admitting: *Deleted

## 2021-11-09 VITALS — Ht 72.0 in | Wt 203.0 lb

## 2021-11-09 DIAGNOSIS — Z1211 Encounter for screening for malignant neoplasm of colon: Secondary | ICD-10-CM

## 2021-11-09 MED ORDER — PEG 3350-KCL-NA BICARB-NACL 420 G PO SOLR: 4000.0000 mL | Freq: Once | ORAL | 0 refills | Status: AC

## 2021-11-09 NOTE — Progress Notes (Signed)
Patient's pre-visit was done today over the phone with the patient. Name,DOB and address verified. Patient denies any allergies to Eggs and Soy. Patient denies any problems with anesthesia/sedation. Patient is not taking any diet pills or blood thinners. No home Oxygen. Insurance confirmed with patient. ? ?Prep instructions sent to pt's MyChart (if available) & mailed to pt-pt is aware. Patient understands to call us back with any questions or concerns. Patient is aware of our care-partner policy.  ? ?EMMI education assigned to the patient for the procedure, sent to [place].  ? ?The patient is COVID-19 vaccinated.   ?

## 2021-11-13 ENCOUNTER — Other Ambulatory Visit: Payer: Self-pay | Admitting: Internal Medicine

## 2021-11-16 ENCOUNTER — Other Ambulatory Visit: Payer: Self-pay | Admitting: Internal Medicine

## 2021-11-17 ENCOUNTER — Other Ambulatory Visit: Payer: Self-pay

## 2021-11-17 MED ORDER — AMLODIPINE BESYLATE 2.5 MG PO TABS: 2.5000 mg | ORAL_TABLET | Freq: Every day | ORAL | 3 refills | Status: DC

## 2021-11-17 NOTE — Telephone Encounter (Signed)
Pt's medication was sent to pt's pharmacy as requested. Confirmation received.  °

## 2021-11-17 NOTE — Telephone Encounter (Signed)
Pt's medication amlodipine 5 mg tablets were decreased, to amlodipine 2.5 mg tablets by another doctor. Would Dr. Harrington Challenger like to refill this medication? Please address ?

## 2021-12-08 ENCOUNTER — Encounter: Payer: Medicare Other | Admitting: Gastroenterology

## 2022-01-12 ENCOUNTER — Ambulatory Visit (AMBULATORY_SURGERY_CENTER): Payer: Medicare Other | Admitting: Gastroenterology

## 2022-01-12 ENCOUNTER — Encounter: Payer: Self-pay | Admitting: Gastroenterology

## 2022-01-12 VITALS — BP 140/91 | HR 64 | Temp 98.0°F | Resp 11 | Ht 72.0 in | Wt 203.0 lb

## 2022-01-12 DIAGNOSIS — Z1211 Encounter for screening for malignant neoplasm of colon: Secondary | ICD-10-CM

## 2022-01-12 DIAGNOSIS — D120 Benign neoplasm of cecum: Secondary | ICD-10-CM

## 2022-01-12 DIAGNOSIS — D122 Benign neoplasm of ascending colon: Secondary | ICD-10-CM

## 2022-01-12 DIAGNOSIS — I10 Essential (primary) hypertension: Secondary | ICD-10-CM | POA: Diagnosis not present

## 2022-01-12 MED ORDER — SODIUM CHLORIDE 0.9 % IV SOLN: 500.0000 mL | Freq: Once | INTRAVENOUS | Status: DC

## 2022-01-12 NOTE — Progress Notes (Signed)
I have reviewed the patient's medical history in detail and updated the computerized patient record.   VS BY DT. 

## 2022-01-12 NOTE — Op Note (Signed)
[facility] Patient Name: Patrick Ferguson Procedure Date: 01/12/2022 7:18 AM MRN: [id number] Endoscopist: Nicki Reaper E. Candis Ferguson , MD Age: 66 Referring MD:  Date of Birth: [birth year] Gender: Male Account #: [id number] Procedure:                Colonoscopy Indications:              Screening for colorectal malignant neoplasm (last                            colonoscopy was more than 10 years ago) Medicines:                Monitored Anesthesia Care Procedure:                Pre-Anesthesia Assessment:                           - Prior to the procedure, a History and Physical                            was performed, and patient medications and                            allergies were reviewed. The patient's tolerance of                            previous anesthesia was also reviewed. The risks                            and benefits of the procedure and the sedation                            options and risks were discussed with the patient.                            All questions were answered, and informed consent                            was obtained. Prior Anticoagulants: The patient has                            taken no previous anticoagulant or antiplatelet                            agents. ASA Grade Assessment: II - A patient with                            mild systemic disease. After reviewing the risks                            and benefits, the patient was deemed in                            satisfactory condition to undergo the procedure.  After obtaining informed consent, the colonoscope                            was passed under direct vision. Throughout the                            procedure, the patient's blood pressure, pulse, and                            oxygen saturations were monitored continuously. The                            CF [id number] #[id number] was introduced through the anus                            and advanced to  the the terminal ileum, with                            identification of the appendiceal orifice and IC                            valve. The colonoscopy was somewhat difficult due                            to significant looping. Successful completion of                            the procedure was aided by using manual pressure.                            The patient tolerated the procedure well. The                            quality of the bowel preparation was adequate. The                            terminal ileum, ileocecal valve, appendiceal                            orifice, and rectum were photographed. The bowel                            preparation used was GoLYTELY via split dose                            instruction. Scope In: 8:01:16 AM Scope Out: 8:21:50 AM Scope Withdrawal Time: 0 hours 13 minutes 57 seconds  Total Procedure Duration: 0 hours 20 minutes 34 seconds  Findings:                 Hemorrhoids were found on perianal exam.                           The digital rectal exam was normal. Pertinent  negatives include normal sphincter tone and no                            palpable rectal lesions.                           A 3 mm polyp was found in the cecum. The polyp was                            sessile. The polyp was removed with a cold snare.                            Resection and retrieval were complete. Estimated                            blood loss was minimal.                           A 3 mm polyp was found in the ascending colon. The                            polyp was sessile. The polyp was removed with a                            cold snare. Resection and retrieval were complete.                            Estimated blood loss was minimal.                           The exam was otherwise normal throughout the                            examined colon.                           The terminal ileum appeared normal.                            Non-bleeding internal hemorrhoids were found during                            retroflexion. The hemorrhoids were Grade III                            (internal hemorrhoids that prolapse but require                            manual reduction).                           No additional abnormalities were found on                            retroflexion. Complications:  No immediate complications. Estimated Blood Loss:     Estimated blood loss was minimal. Impression:               - Hemorrhoids found on perianal exam.                           - One 3 mm polyp in the cecum, removed with a cold                            snare. Resected and retrieved.                           - One 3 mm polyp in the ascending colon, removed                            with a cold snare. Resected and retrieved.                           - The examined portion of the ileum was normal.                           - Non-bleeding internal hemorrhoids. Recommendation:           - Patient has a contact number available for                            emergencies. The signs and symptoms of potential                            delayed complications were discussed with the                            patient. Return to normal activities tomorrow.                            Written discharge instructions were provided to the                            patient.                           - Resume previous diet.                           - Continue present medications.                           - Await pathology results.                           - Repeat colonoscopy (date not yet determined) for                            surveillance based on pathology results. Patrick Oki E. Candis Schatz, MD 01/12/2022 8:26:01 AM This report has been signed electronically.

## 2022-01-12 NOTE — Progress Notes (Signed)
[facility] History and Physical   Primary Care Physician:  Deland Pretty, MD   Reason for Procedure:   Colon cancer screening  Plan:    Screening colonoscopy     HPI: Patrick Ferguson is a 66 y.o. male undergoing  average risk screening colonoscopy.  He has no family history of colon cancer and no chronic GI symptoms. He had a colonoscopy in 2011 which was normal per patient.   Past Medical History:  Diagnosis Date   Hypertension     Past Surgical History:  Procedure Laterality Date   A-FLUTTER ABLATION N/A 11/18/2017   Procedure: A-FLUTTER ABLATION;  Surgeon: Evans Lance, MD;  Location: [place] [facility];  Service: Cardiovascular;  Laterality: N/A;   CARDIOVERSION N/A 10/10/2017   Procedure: CARDIOVERSION;  Surgeon: Larey Dresser, MD;  Location: [facility] ENDOSCOPY;  Service: Cardiovascular;  Laterality: N/A;   COLONOSCOPY  2011   in [place], [place] -normal exam per pt   TEE WITHOUT CARDIOVERSION N/A 10/10/2017   Procedure: TRANSESOPHAGEAL ECHOCARDIOGRAM (TEE);  Surgeon: Larey Dresser, MD;  Location: [facility] ENDOSCOPY;  Service: Cardiovascular;  Laterality: N/A;    Prior to Admission medications   Medication Sig Start Date End Date Taking? Authorizing Provider  amLODipine (NORVASC) 2.5 MG tablet Take 1 tablet (2.5 mg total) by mouth daily. 11/17/21  Yes Fay Records, MD  B Complex Vitamins (B COMPLEX PO) Take by mouth daily.   Yes [provider]  Cholecalciferol (VITAMIN D3) 50 MCG (2000 UT) TABS Take 4,000 Units by mouth daily.   Yes [provider]  Multiple Vitamins-Minerals (EYE VITAMINS PO) Take by mouth.   Yes [provider]  rosuvastatin (CRESTOR) 20 MG tablet TAKE ONE TABLET BY MOUTH DAILY 10/16/21  Yes Fay Records, MD  metoprolol succinate (TOPROL XL) 25 MG 24 hr tablet Take 1 tablet (25 mg total) by mouth daily. Patient not taking: Reported on 11/09/2021 10/17/20   Fay Records, MD    Current Outpatient Medications   Medication Sig Dispense Refill   amLODipine (NORVASC) 2.5 MG tablet Take 1 tablet (2.5 mg total) by mouth daily. 90 tablet 3   B Complex Vitamins (B COMPLEX PO) Take by mouth daily.     Cholecalciferol (VITAMIN D3) 50 MCG (2000 UT) TABS Take 4,000 Units by mouth daily.     Multiple Vitamins-Minerals (EYE VITAMINS PO) Take by mouth.     rosuvastatin (CRESTOR) 20 MG tablet TAKE ONE TABLET BY MOUTH DAILY 90 tablet 3   metoprolol succinate (TOPROL XL) 25 MG 24 hr tablet Take 1 tablet (25 mg total) by mouth daily. (Patient not taking: Reported on 11/09/2021) 90 tablet 3   Current Facility-Administered Medications  Medication Dose Route Frequency Provider Last Rate Last Admin   0.9 %  sodium chloride infusion  500 mL Intravenous Once Daryel November, MD        Allergies as of 01/12/2022 - Review Complete 01/12/2022  Allergen Reaction Noted   Sulfites Other (See Comments) 11/11/2017    Family History  Problem Relation Age of Onset   Heart disease Father    Colon cancer Neg Hx    Colon polyps Neg Hx    Esophageal cancer Neg Hx    Rectal cancer Neg Hx    Stomach cancer Neg Hx     Social History   Socioeconomic History   Marital status: Married    Spouse name: Not on file   Number of children: Not on file   Years  of education: Not on file   Highest education level: Not on file  Occupational History   Not on file  Tobacco Use   Smoking status: Never   Smokeless tobacco: Never  Vaping Use   Vaping Use: Never used  Substance and Sexual Activity   Alcohol use: Yes    Alcohol/week: 4.0 standard drinks of alcohol    Types: 4 Standard drinks or equivalent per week    Comment: occ   Drug use: No   Sexual activity: Not on file  Other Topics Concern   Not on file  Social History Narrative   Not on file   Social Determinants of Health   Financial Resource Strain: Not on file  Food Insecurity: Not on file  Transportation Needs: Not on file  Physical Activity: Not on file   Stress: Not on file  Social Connections: Not on file  Intimate Partner Violence: Not on file    Review of Systems:  All other review of systems negative except as mentioned in the HPI.  Physical Exam: Vital signs BP (!) 148/88   Pulse 62   Temp 98 F (36.7 C) (Temporal)   Ht 6' (1.829 m)   Wt 203 lb (92.1 kg)   SpO2 98%   BMI 27.53 kg/m   General:   Alert,  Well-developed, well-nourished, pleasant and cooperative in NAD Airway:  Mallampati 2 Lungs:  Clear throughout to auscultation.   Heart:  Regular rate and rhythm; no murmurs, clicks, rubs,  or gallops. Abdomen:  Soft, nontender and nondistended. Normal bowel sounds.   Neuro/Psych:  Normal mood and affect. A and O x 3   Lujuana Kapler E. Candis Schatz, MD [facility]

## 2022-01-12 NOTE — Progress Notes (Signed)
Called to room to assist during endoscopic procedure.  Patient ID and intended procedure confirmed with present staff. Received instructions for my participation in the procedure from the performing physician.  

## 2022-01-12 NOTE — Patient Instructions (Signed)
Resume previous diet and medications. Awaiting pathology results. Repeat Colonoscopy date to be determined based on pathology results.  YOU HAD AN ENDOSCOPIC PROCEDURE TODAY AT [facility]:   Refer to the procedure report that was given to you for any specific questions about what was found during the examination.  If the procedure report does not answer your questions, please call your gastroenterologist to clarify.  If you requested that your care partner not be given the details of your procedure findings, then the procedure report has been included in a sealed envelope for you to review at your convenience later.  YOU SHOULD EXPECT: Some feelings of bloating in the abdomen. Passage of more gas than usual.  Walking can help get rid of the air that was put into your GI tract during the procedure and reduce the bloating. If you had a lower endoscopy (such as a colonoscopy or flexible sigmoidoscopy) you may notice spotting of blood in your stool or on the toilet paper. If you underwent a bowel prep for your procedure, you may not have a normal bowel movement for a few days.  Please Note:  You might notice some irritation and congestion in your nose or some drainage.  This is from the oxygen used during your procedure.  There is no need for concern and it should clear up in a day or so.  SYMPTOMS TO REPORT IMMEDIATELY:  Following lower endoscopy (colonoscopy or flexible sigmoidoscopy):  Excessive amounts of blood in the stool  Significant tenderness or worsening of abdominal pains  Swelling of the abdomen that is new, acute  Fever of 100F or higher  For urgent or emergent issues, a gastroenterologist can be reached at any hour by calling [phone]. Do not use MyChart messaging for urgent concerns.    DIET:  We do recommend a small meal at first, but then you may proceed to your regular diet.  Drink plenty of fluids but you should avoid alcoholic beverages for 24  hours.  ACTIVITY:  You should plan to take it easy for the rest of today and you should NOT DRIVE or use heavy machinery until tomorrow (because of the sedation medicines used during the test).    FOLLOW UP: Our staff will call the number listed on your records 24-72 hours following your procedure to check on you and address any questions or concerns that you may have regarding the information given to you following your procedure. If we do not reach you, we will leave a message.  We will attempt to reach you two times.  During this call, we will ask if you have developed any symptoms of COVID 19. If you develop any symptoms (ie: fever, flu-like symptoms, shortness of breath, cough etc.) before then, please call [phone].  If you test positive for Covid 19 in the 2 weeks post procedure, please call and report this information to [place].    If any biopsies were taken you will be contacted by phone or by letter within the next 1-3 weeks.  Please call us at [phone] if you have not heard about the biopsies in 3 weeks.    SIGNATURES/CONFIDENTIALITY: You and/or your care partner have signed paperwork which will be entered into your electronic medical record.  These signatures attest to the fact that that the information above on your After Visit Summary has been reviewed and is understood.  Full responsibility of the confidentiality of this discharge information lies with you and/or your care-partner.

## 2022-01-12 NOTE — Progress Notes (Signed)
PT taken to PACU. Monitors in place. VSS. Report given to RN. 

## 2022-01-18 NOTE — Progress Notes (Signed)
Patrick Ferguson,  The two polyps which I removed during your recent procedure were proven to be completely benign but are considered "pre-cancerous" polyps that MAY have grown into cancer if they had not been removed.  Studies shows that at least 20% of women over age 68 and 30% of men over age 58 have pre-cancerous polyps.  Based on current nationally recognized surveillance guidelines, I recommend that you have a repeat colonoscopy in 7 years.   If you develop any new rectal bleeding, abdominal pain or significant bowel habit changes, please contact me before then.

## 2022-04-25 DIAGNOSIS — M5442 Lumbago with sciatica, left side: Secondary | ICD-10-CM | POA: Diagnosis not present

## 2022-04-25 DIAGNOSIS — N529 Male erectile dysfunction, unspecified: Secondary | ICD-10-CM | POA: Diagnosis not present

## 2022-04-25 DIAGNOSIS — M5441 Lumbago with sciatica, right side: Secondary | ICD-10-CM | POA: Diagnosis not present

## 2022-05-15 DIAGNOSIS — M5416 Radiculopathy, lumbar region: Secondary | ICD-10-CM | POA: Diagnosis not present

## 2022-05-25 DIAGNOSIS — M5416 Radiculopathy, lumbar region: Secondary | ICD-10-CM | POA: Diagnosis not present

## 2022-06-11 DIAGNOSIS — M5416 Radiculopathy, lumbar region: Secondary | ICD-10-CM | POA: Diagnosis not present

## 2022-06-25 DIAGNOSIS — E7800 Pure hypercholesterolemia, unspecified: Secondary | ICD-10-CM | POA: Diagnosis not present

## 2022-06-25 DIAGNOSIS — Z0000 Encounter for general adult medical examination without abnormal findings: Secondary | ICD-10-CM | POA: Diagnosis not present

## 2022-06-25 DIAGNOSIS — Z125 Encounter for screening for malignant neoplasm of prostate: Secondary | ICD-10-CM | POA: Diagnosis not present

## 2022-07-03 DIAGNOSIS — I2510 Atherosclerotic heart disease of native coronary artery without angina pectoris: Secondary | ICD-10-CM | POA: Diagnosis not present

## 2022-07-03 DIAGNOSIS — E7800 Pure hypercholesterolemia, unspecified: Secondary | ICD-10-CM | POA: Diagnosis not present

## 2022-07-03 DIAGNOSIS — R3129 Other microscopic hematuria: Secondary | ICD-10-CM | POA: Diagnosis not present

## 2022-07-03 DIAGNOSIS — Z0000 Encounter for general adult medical examination without abnormal findings: Secondary | ICD-10-CM | POA: Diagnosis not present

## 2022-07-03 DIAGNOSIS — Z9889 Other specified postprocedural states: Secondary | ICD-10-CM | POA: Diagnosis not present

## 2022-07-03 DIAGNOSIS — Z8679 Personal history of other diseases of the circulatory system: Secondary | ICD-10-CM | POA: Diagnosis not present

## 2022-07-03 DIAGNOSIS — Z23 Encounter for immunization: Secondary | ICD-10-CM | POA: Diagnosis not present

## 2022-07-03 DIAGNOSIS — I10 Essential (primary) hypertension: Secondary | ICD-10-CM | POA: Diagnosis not present

## 2022-07-10 DIAGNOSIS — M5416 Radiculopathy, lumbar region: Secondary | ICD-10-CM | POA: Diagnosis not present

## 2022-08-08 DIAGNOSIS — Z23 Encounter for immunization: Secondary | ICD-10-CM | POA: Diagnosis not present

## 2022-08-08 DIAGNOSIS — I10 Essential (primary) hypertension: Secondary | ICD-10-CM | POA: Diagnosis not present

## 2022-08-08 DIAGNOSIS — Z7184 Encounter for health counseling related to travel: Secondary | ICD-10-CM | POA: Diagnosis not present

## 2022-08-14 DIAGNOSIS — Z23 Encounter for immunization: Secondary | ICD-10-CM | POA: Diagnosis not present

## 2022-08-15 DIAGNOSIS — N029 Recurrent and persistent hematuria with unspecified morphologic changes: Secondary | ICD-10-CM | POA: Diagnosis not present

## 2022-08-15 DIAGNOSIS — N529 Male erectile dysfunction, unspecified: Secondary | ICD-10-CM | POA: Diagnosis not present

## 2022-08-15 DIAGNOSIS — R8290 Unspecified abnormal findings in urine: Secondary | ICD-10-CM | POA: Diagnosis not present

## 2022-08-30 DIAGNOSIS — Z23 Encounter for immunization: Secondary | ICD-10-CM | POA: Diagnosis not present

## 2022-09-25 DIAGNOSIS — R300 Dysuria: Secondary | ICD-10-CM | POA: Diagnosis not present

## 2022-09-25 DIAGNOSIS — Z7184 Encounter for health counseling related to travel: Secondary | ICD-10-CM | POA: Diagnosis not present

## 2022-09-26 DIAGNOSIS — H353132 Nonexudative age-related macular degeneration, bilateral, intermediate dry stage: Secondary | ICD-10-CM | POA: Diagnosis not present

## 2022-09-26 DIAGNOSIS — H04123 Dry eye syndrome of bilateral lacrimal glands: Secondary | ICD-10-CM | POA: Diagnosis not present

## 2022-09-26 DIAGNOSIS — H43393 Other vitreous opacities, bilateral: Secondary | ICD-10-CM | POA: Diagnosis not present

## 2022-09-26 DIAGNOSIS — H40013 Open angle with borderline findings, low risk, bilateral: Secondary | ICD-10-CM | POA: Diagnosis not present

## 2022-10-22 ENCOUNTER — Other Ambulatory Visit: Payer: Self-pay | Admitting: Internal Medicine

## 2022-11-09 ENCOUNTER — Other Ambulatory Visit: Payer: Self-pay | Admitting: Internal Medicine

## 2022-11-13 ENCOUNTER — Ambulatory Visit: Payer: Medicare Other | Attending: Internal Medicine | Admitting: Internal Medicine

## 2022-11-13 ENCOUNTER — Encounter: Payer: Self-pay | Admitting: Internal Medicine

## 2022-11-13 ENCOUNTER — Ambulatory Visit: Payer: Medicare Other

## 2022-11-13 VITALS — BP 142/76 | HR 79 | Ht 72.0 in | Wt 194.2 lb

## 2022-11-13 DIAGNOSIS — I4891 Unspecified atrial fibrillation: Secondary | ICD-10-CM

## 2022-11-13 DIAGNOSIS — E782 Mixed hyperlipidemia: Secondary | ICD-10-CM | POA: Insufficient documentation

## 2022-11-13 DIAGNOSIS — E559 Vitamin D deficiency, unspecified: Secondary | ICD-10-CM | POA: Diagnosis not present

## 2022-11-13 DIAGNOSIS — Z79899 Other long term (current) drug therapy: Secondary | ICD-10-CM | POA: Diagnosis not present

## 2022-11-13 DIAGNOSIS — I493 Ventricular premature depolarization: Secondary | ICD-10-CM | POA: Diagnosis not present

## 2022-11-13 DIAGNOSIS — I10 Essential (primary) hypertension: Secondary | ICD-10-CM | POA: Insufficient documentation

## 2022-11-13 DIAGNOSIS — E785 Hyperlipidemia, unspecified: Secondary | ICD-10-CM | POA: Insufficient documentation

## 2022-11-13 MED ORDER — METOPROLOL SUCCINATE ER 25 MG PO TB24: 25.0000 mg | ORAL_TABLET | Freq: Every day | ORAL | 3 refills | Status: DC

## 2022-11-13 NOTE — Progress Notes (Signed)
Cardiology Office Note   Date:  11/13/2022   ID:  Patrick Ferguson, DOB 1956-06-10, MRN [id number]  PCP:  Merri Brunette, MD  Cardiologist:   Dietrich Pates, MD   Pt presents for f/u of HTN    History of Present Illness: Patrick Ferguson is a 67 y.o. male with a history of HTN and atrial flutter.  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   In April 2019 underwent ablation.   REpeat echo after  LVEF 50 to 55%  Pt had episode of heart pounding in the past  Wore an event monitor   Only occaisonal PVCs, short burst PAT seen  Resolved  I last saw the pt in  Feb 2022. EKG had PVCs   Exam had frequent skps  I recomm a monitor   48 hour monitor showed 13% PVCs   Recomm Toprol XL 25   F/U montor ordered but not done I saw the pt in JAn 2023  Another monitor showed SR with 19.8% PVCs  Placced on Toprol XL  Since seen the pt denies palpitations.   No CP   no SOB     He is active   Recently got back from a safari in [place]   Current Meds  Medication Sig   amLODipine (NORVASC) 2.5 MG tablet TAKE ONE TABLET BY MOUTH DAILY   rosuvastatin (CRESTOR) 20 MG tablet TAKE ONE TABLET BY MOUTH DAILY     Allergies:   Sulfites   Past Medical History:  Diagnosis Date   Hypertension     Past Surgical History:  Procedure Laterality Date   A-FLUTTER ABLATION N/A 11/18/2017   Procedure: A-FLUTTER ABLATION;  Surgeon: Marinus Maw, MD;  Location: [facility]ASIVE CV LAB;  Service: Cardiovascular;  Laterality: N/A;   CARDIOVERSION N/A 10/10/2017   Procedure: CARDIOVERSION;  Surgeon: Laurey Morale, MD;  Location: [facility];  Service: Cardiovascular;  Laterality: N/A;   COLONOSCOPY  2011   in [place], [place] -normal exam per pt   TEE WITHOUT CARDIOVERSION N/A 10/10/2017   Procedure: TRANSESOPHAGEAL ECHOCARDIOGRAM (TEE);  Surgeon: Laurey Morale, MD;  Location: [facility] ENDOSCOPY;  Service: Cardiovascular;  Laterality: N/A;     Social History:  The patient   reports that he has never smoked. He has never used smokeless tobacco. He reports current alcohol use of about 4.0 standard drinks of alcohol per week. He reports that he does not use drugs.   Family History:  The patient's family history includes Heart disease in his father.    ROS:  Please see the history of present illness. All other systems are reviewed and  Negative to the above problem except as noted.    PHYSICAL EXAM: VS:  BP (!) 142/76   Pulse 79   Ht 6' (1.829 m)   Wt 194 lb 3.2 oz (88.1 kg)   SpO2 97%   BMI 26.34 kg/m   GEN: Pt is  in no acute distress  HEENT: normal  Neck: JVP normal   No carotid bruit Cardiac: RRR .  S1, S2.  No Murmurs   No LE  edema  Respiratory:  clear to auscultation  GI: soft, nontender, nondistended  No hepatomegaly  MS: no deformity Moving all extremities      EKG:  EKG shows NSR 78 bpm   PVCs     Lipid Panel    Component Value Date/Time   CHOL 180 01/25/2020 1002   TRIG 72 01/25/2020 1002  HDL 90 01/25/2020 1002   CHOLHDL 2.0 01/25/2020 1002   LDLCALC 77 01/25/2020 1002      Wt Readings from Last 3 Encounters:  11/13/22 194 lb 3.2 oz (88.1 kg)  01/12/22 203 lb (92.1 kg)  11/09/21 203 lb (92.1 kg)      ASSESSMENT AND PLAN:  1  PVCs   Pt had 19.8% burden on monitor 1  year ago  Never started b blocker  He does not sense anything    Today has PVCs on EKG    I would recomm repeat monitor to reestablish burden before treating   2  Atrial flutter Pt is s/p ablation   NO evidence of recurrence  3  Hx systolic CHF  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   The patient does not have concerns regarding medicines.  Signed, Dietrich Pates, MD  11/13/2022 8:10 PM    [facility] [address], [place], [place]  [address] Phone: [phone]; Fax: [phone]

## 2022-11-13 NOTE — Patient Instructions (Signed)
Medication Instructions:  Toprol 25 mg daily  *If you need a refill on your cardiac medications before your next appointment, please call your pharmacy*   Lab Work: BMET, TSH, NMR, APO B, LIPO A, HGBA1C, VIT D, CBC  If you have labs (blood work) drawn today and your tests are completely normal, you will receive your results only by: MyChart Message (if you have MyChart) OR A paper copy in the mail If you have any lab test that is abnormal or we need to change your treatment, we will call you to review the results.   Testing/Procedures: ZIO XT- Long Term Monitor Instructions IN 4 WEEKS WILL BE SENT TO YOU   Your physician has requested you wear a ZIO patch monitor for 3 days.  This is a single patch monitor. [employer] supplies one patch monitor per enrollment. Additional stickers are not available. Please do not apply patch if you will be having a Nuclear Stress Test,  Echocardiogram, Cardiac CT, MRI, or Chest Xray during the period you would be wearing the  monitor. The patch cannot be worn during these tests. You cannot remove and re-apply the  ZIO XT patch monitor.  Your ZIO patch monitor will be mailed 3 day USPS to your address on file. It may take 3-5 days  to receive your monitor after you have been enrolled.  Once you have received your monitor, please review the enclosed instructions. Your monitor  has already been registered assigning a specific monitor serial # to you.  Billing and Patient Assistance Program Information  We have supplied [employer] with any of your insurance information on file for billing purposes. [employer] offers a sliding scale Patient Assistance Program for patients that do not have  insurance, or whose insurance does not completely cover the cost of the ZIO monitor.  You must apply for the Patient Assistance Program to qualify for this discounted rate.  To apply, please call [employer] at [phone], select option 4, select option 2, ask to apply for   Patient Assistance Program. Meredeth Ide will ask your household income, and how many people  are in your household. They will quote your out-of-pocket cost based on that information.  [employer] will also be able to set up a 6-month, interest-free payment plan if needed.  Applying the monitor   Shave hair from upper left chest.  Hold abrader disc by orange tab. Rub abrader in 40 strokes over the upper left chest as  indicated in your monitor instructions.  Clean area with 4 enclosed alcohol pads. Let dry.  Apply patch as indicated in monitor instructions. Patch will be placed under collarbone on left  side of chest with arrow pointing upward.  Rub patch adhesive wings for 2 minutes. Remove white label marked "1". Remove the white  label marked "2". Rub patch adhesive wings for 2 additional minutes.  While looking in a mirror, press and release button in center of patch. A small green light will  flash 3-4 times. This will be your only indicator that the monitor has been turned on.  Do not shower for the first 24 hours. You may shower after the first 24 hours.  Press the button if you feel a symptom. You will hear a small click. Record Date, Time and  Symptom in the Patient Logbook.  When you are ready to remove the patch, follow instructions on the last 2 pages of Patient  Logbook. Stick patch monitor onto the last page of Patient Logbook.  Place Patient Logbook  in the blue and white box. Use locking tab on box and tape box closed  securely. The blue and white box has prepaid postage on it. Please place it in the mailbox as  soon as possible. Your physician should have your test results approximately 7 days after the  monitor has been mailed back to [facility].  Call [facility] Customer Care at [phone] if you have questions regarding  your ZIO XT patch monitor. Call them immediately if you see an orange light blinking on your  monitor.  If your monitor falls off in less than 4  days, contact our Monitor department at [phone].  If your monitor becomes loose or falls off after 4 days call [employer] at [phone] for  suggestions on securing your monitor    Follow-Up: At [facility], you and your health needs are our priority.  As part of our continuing mission to provide you with exceptional heart care, we have created designated Provider Care Teams.  These Care Teams include your primary Cardiologist (physician) and Advanced Practice Providers (APPs -  Physician Assistants and Nurse Practitioners) who all work together to provide you with the care you need, when you need it.  We recommend signing up for the patient portal called "MyChart".  Sign up information is provided on this After Visit Summary.  MyChart is used to connect with patients for Virtual Visits (Telemedicine).  Patients are able to view lab/test results, encounter notes, upcoming appointments, etc.  Non-urgent messages can be sent to your provider as well.   To learn more about what you can do with MyChart, go to [email or web address].    Your next appointment:   1 year(s)  Provider:   Dietrich Pates, MD     Other Instructions

## 2022-11-13 NOTE — Progress Notes (Unsigned)
Enrolled patient for a 3 day Zio XT monitor to be mailed to patients home  

## 2022-11-14 LAB — CBC
Hematocrit: 41.9 % (ref 37.5–51.0)
Hemoglobin: 14.1 g/dL (ref 13.0–17.7)
MCH: 31.8 pg (ref 26.6–33.0)
MCHC: 33.7 g/dL (ref 31.5–35.7)
MCV: 94 fL (ref 79–97)
Platelets: 260 10*3/uL (ref 150–450)
RBC: 4.44 x10E6/uL (ref 4.14–5.80)
RDW: 12 % (ref 11.6–15.4)
WBC: 6.9 10*3/uL (ref 3.4–10.8)

## 2022-11-14 LAB — APOLIPOPROTEIN B: Apolipoprotein B: 76 mg/dL (ref <90)

## 2022-11-14 LAB — BASIC METABOLIC PANEL
BUN/Creatinine Ratio: 17 (ref 10–24)
BUN: 18 mg/dL (ref 8–27)
CO2: 21 mmol/L (ref 20–29)
Calcium: 9.4 mg/dL (ref 8.6–10.2)
Chloride: 106 mmol/L (ref 96–106)
Creatinine, Ser: 1.04 mg/dL (ref 0.76–1.27)
Glucose: 91 mg/dL (ref 70–99)
Potassium: 3.9 mmol/L (ref 3.5–5.2)
Sodium: 141 mmol/L (ref 134–144)
eGFR: 78 mL/min/{1.73_m2} (ref >59)

## 2022-11-14 LAB — NMR, LIPOPROFILE
Cholesterol, Total: 192 mg/dL (ref 100–199)
HDL Particle Number: 35.2 umol/L (ref >=30.5)
HDL-C: 72 mg/dL (ref >39)
LDL Particle Number: 876 nmol/L (ref <1000)
LDL Size: 21.4 nm (ref >20.5)
LDL-C (NIH Calc): 97 mg/dL (ref 0–99)
LP-IR Score: 30 (ref <=45)
Small LDL Particle Number: 210 nmol/L (ref <=527)
Triglycerides: 135 mg/dL (ref 0–149)

## 2022-11-14 LAB — TSH: TSH: 2.53 u[IU]/mL (ref 0.450–4.500)

## 2022-11-14 LAB — HEMOGLOBIN A1C
Est. average glucose Bld gHb Est-mCnc: 114 mg/dL
Hgb A1c MFr Bld: 5.6 % (ref 4.8–5.6)

## 2022-11-14 LAB — LIPOPROTEIN A (LPA): Lipoprotein (a): 13.1 nmol/L (ref <75.0)

## 2022-11-14 LAB — VITAMIN D 25 HYDROXY (VIT D DEFICIENCY, FRACTURES): Vit D, 25-Hydroxy: 28.8 ng/mL — ABNORMAL LOW (ref 30.0–100.0)

## 2022-11-14 MED ORDER — SILDENAFIL CITRATE 50 MG PO TABS: ORAL_TABLET | ORAL | 3 refills | Status: DC

## 2022-11-15 ENCOUNTER — Telehealth: Payer: Self-pay

## 2022-11-15 DIAGNOSIS — I4891 Unspecified atrial fibrillation: Secondary | ICD-10-CM

## 2022-11-15 DIAGNOSIS — Z79899 Other long term (current) drug therapy: Secondary | ICD-10-CM

## 2022-11-15 DIAGNOSIS — E782 Mixed hyperlipidemia: Secondary | ICD-10-CM

## 2022-11-15 DIAGNOSIS — E785 Hyperlipidemia, unspecified: Secondary | ICD-10-CM

## 2022-11-15 NOTE — Telephone Encounter (Signed)
Vit D is low   I would go back to 4000 U per day Follow up later in year   Pt advised and he is very reluctant to start the Zetia... he says he has not been watching his diet and he says he has gained some unnecessary weight. He is asking to try and alter his diet and exercise for now prior to stating a new med... he will come back and have some repeat labs 02/13/23 to see how he is doing. He asked to have his HGBA1C rechecked.Marland Kitchen   He will start the Vit D for now.

## 2022-11-15 NOTE — Telephone Encounter (Signed)
-----   Message from Pricilla Riffle, MD sent at 11/15/2022 12:58 PM EDT ----- Thyroid function is normal CBC is normal  Electrolytes and kidney function are normal  Lipids   LDL 97    Is he taking statin regularly?  IF so, I would add Zetia to regimen   He had calciffication of LAD on CT in past      Follow up lipomed in 8 wk with liver panel

## 2022-11-20 ENCOUNTER — Emergency Department (HOSPITAL_BASED_OUTPATIENT_CLINIC_OR_DEPARTMENT_OTHER): Payer: Medicare Other

## 2022-11-20 ENCOUNTER — Other Ambulatory Visit: Payer: Self-pay

## 2022-11-20 ENCOUNTER — Encounter (HOSPITAL_BASED_OUTPATIENT_CLINIC_OR_DEPARTMENT_OTHER): Payer: Self-pay | Admitting: Emergency Medicine

## 2022-11-20 ENCOUNTER — Emergency Department (HOSPITAL_BASED_OUTPATIENT_CLINIC_OR_DEPARTMENT_OTHER)
Admission: EM | Admit: 2022-11-20 | Discharge: 2022-11-20 | Disposition: A | Discharge status: Home / Self Care | Payer: Medicare Other | Attending: Emergency Medicine | Admitting: Emergency Medicine

## 2022-11-20 DIAGNOSIS — R008 Other abnormalities of heart beat: Secondary | ICD-10-CM | POA: Diagnosis not present

## 2022-11-20 DIAGNOSIS — M545 Low back pain, unspecified: Secondary | ICD-10-CM | POA: Diagnosis not present

## 2022-11-20 DIAGNOSIS — Z79899 Other long term (current) drug therapy: Secondary | ICD-10-CM | POA: Diagnosis not present

## 2022-11-20 DIAGNOSIS — I498 Other specified cardiac arrhythmias: Secondary | ICD-10-CM

## 2022-11-20 DIAGNOSIS — I10 Essential (primary) hypertension: Secondary | ICD-10-CM | POA: Insufficient documentation

## 2022-11-20 DIAGNOSIS — M5136 Other intervertebral disc degeneration, lumbar region: Secondary | ICD-10-CM | POA: Insufficient documentation

## 2022-11-20 DIAGNOSIS — M48061 Spinal stenosis, lumbar region without neurogenic claudication: Secondary | ICD-10-CM | POA: Diagnosis not present

## 2022-11-20 DIAGNOSIS — M4126 Other idiopathic scoliosis, lumbar region: Secondary | ICD-10-CM | POA: Diagnosis not present

## 2022-11-20 LAB — URINALYSIS, ROUTINE W REFLEX MICROSCOPIC
Bacteria, UA: NONE SEEN
Bilirubin Urine: NEGATIVE
Glucose, UA: NEGATIVE mg/dL
Ketones, ur: NEGATIVE mg/dL
Leukocytes,Ua: NEGATIVE
Nitrite: NEGATIVE
Specific Gravity, Urine: 1.019 (ref 1.005–1.030)
pH: 6 (ref 5.0–8.0)

## 2022-11-20 MED ORDER — HYDROMORPHONE HCL 1 MG/ML IJ SOLN
1.0000 mg | Freq: Once | INTRAMUSCULAR | Status: AC
  Administered 2022-11-20: 1 mg via INTRAMUSCULAR
  Filled 2022-11-20: qty 1

## 2022-11-20 MED ORDER — HYDROCODONE-ACETAMINOPHEN 5-325 MG PO TABS: 1.0000 | ORAL_TABLET | Freq: Four times a day (QID) | ORAL | 0 refills | Status: AC | PRN

## 2022-11-20 MED ORDER — ONDANSETRON 4 MG PO TBDP
8.0000 mg | ORAL_TABLET | Freq: Once | ORAL | Status: AC
  Administered 2022-11-20: 8 mg via ORAL
  Filled 2022-11-20: qty 2

## 2022-11-20 NOTE — ED Triage Notes (Signed)
Pt c/o lower back pain that started yesterday. No urinary s/s. No injury/trauma. Hx sciatica.

## 2022-11-20 NOTE — ED Notes (Signed)
Patient transported to CT via rad staff  

## 2022-11-20 NOTE — ED Provider Notes (Signed)
[facility] Provider Note: Lowella Dell, MD, FACEP  CSN: [id number] MRN: [id number] ARRIVAL: 11/20/22 at 0506 ROOM: DB012/DB012   CHIEF COMPLAINT  Back Pain   HISTORY OF PRESENT ILLNESS  11/20/22 5:18 AM Patrick Ferguson is a 67 y.o. male with a history of sciatica for the past year which has been treated with physical therapy.  He has not been on narcotic pain medication.  He is here with back pain that began this morning.  It is located in his right lumbar region and, unlike previous episodes of sciatica, does not radiate down the back of his leg.  He rates it as a 9 out of 10 and it is worse with movement.  He denies any associated numbness, weakness or change in bowel or bladder function.  He has had nausea with it and became diaphoretic at 1 point.   Past Medical History:  Diagnosis Date   Hypertension     Past Surgical History:  Procedure Laterality Date   A-FLUTTER ABLATION N/A 11/18/2017   Procedure: A-FLUTTER ABLATION;  Surgeon: Marinus Maw, MD;  [facility]: [facility]VAS[facility] LAB;  Service: Cardiovascular;  Laterality: N/A;   CARDIOVERSION N/A 10/10/2017   Procedure: CARDIOVERSION;  Surgeon: Laurey Morale, MD;  Location: [facility] ENDOSCOPY;  Service: Cardiovascular;  Laterality: N/A;   COLONOSCOPY  2011   in [place], [place] -normal exam per pt   TEE WITHOUT CARDIOVERSION N/A 10/10/2017   Procedure: TRANSESOPHAGEAL ECHOCARDIOGRAM (TEE);  Surgeon: Laurey Morale, MD;  Location: [facility] ENDOSCOPY;  Service: Cardiovascular;  Laterality: N/A;    Family History  Problem Relation Age of Onset   Heart disease Father    Colon cancer Neg Hx    Colon polyps Neg Hx    Esophageal cancer Neg Hx    Rectal cancer Neg Hx    Stomach cancer Neg Hx     Social History   Tobacco Use   Smoking status: Never   Smokeless tobacco: Never  Vaping Use   Vaping Use: Never used  Substance Use Topics   Alcohol use: Yes    Alcohol/week: 4.0 standard drinks of alcohol    Types: 4 Standard  drinks or equivalent per week    Comment: occ   Drug use: No    Prior to Admission medications   Medication Sig Start Date End Date Taking? Authorizing Provider  amLODipine (NORVASC) 2.5 MG tablet TAKE ONE TABLET BY MOUTH DAILY 11/09/22   Pricilla Riffle, MD  B Complex Vitamins (B COMPLEX PO) Take by mouth daily. Patient not taking: Reported on 11/13/2022    [provider]  Cholecalciferol (VITAMIN D3) 50 MCG (2000 UT) TABS Take 4,000 Units by mouth daily. Patient not taking: Reported on 11/13/2022    [provider]  metoprolol succinate (TOPROL XL) 25 MG 24 hr tablet Take 1 tablet (25 mg total) by mouth daily. 11/13/22   Pricilla Riffle, MD  Multiple Vitamins-Minerals (EYE VITAMINS PO) Take by mouth. Patient not taking: Reported on 11/13/2022    [provider]  rosuvastatin (CRESTOR) 20 MG tablet TAKE ONE TABLET BY MOUTH DAILY 10/22/22   Pricilla Riffle, MD  sildenafil (VIAGRA) 50 MG tablet Take 1-2 tablets by mouth as needed. 11/14/22   Pricilla Riffle, MD    Allergies Sulfites   REVIEW OF SYSTEMS  Negative except as noted here or in the History of Present Illness.   PHYSICAL EXAMINATION  Initial Vital Signs Blood pressure (!) 187/108, pulse 75, temperature 97.8 F (  36.6 C), temperature source Oral, resp. rate 18, height 6' (1.829 m), weight 88 kg, SpO2 100 %.  Examination General: Well-developed, well-nourished male in no acute distress; appearance consistent with age of record HENT: normocephalic; atraumatic Eyes: Normal appearance Neck: supple Heart: Bigeminy rhythm Lungs: clear to auscultation bilaterally Abdomen: soft; nondistended; nontender; bowel sounds present Back: Negative straight leg raise; pain on movement of lower back; no CVA tenderness Extremities: No deformity; full range of motion; pulses normal Neurologic: Awake, alert and oriented; motor function intact in all extremities and symmetric; no facial droop Skin: Warm and dry Psychiatric: Normal  mood and affect   RESULTS  Summary of this visit's results, reviewed and interpreted by myself:   EKG Interpretation  Date/Time:  Tuesday November 20 2022 05:37:26 EDT Ventricular Rate:  93 PR Interval:  179 QRS Duration: 104 QT Interval:  320 QTC Calculation: 306 R Axis:   6 Text Interpretation: Sinus rhythm Ventricular bigeminy RSR' in V1 or V2, right VCD or RVH Previously AFlutter Confirmed by Paula Libra ([id number]) on 11/20/2022 5:41:30 AM       Laboratory Studies: Results for orders placed or performed during the hospital encounter of 11/20/22 (from the past 24 hour(s))  Urinalysis, Routine w reflex microscopic -Urine, Clean Catch     Status: Abnormal   Collection Time: 11/20/22  5:20 AM  Result Value Ref Range   Color, Urine YELLOW YELLOW   APPearance CLEAR CLEAR   Specific Gravity, Urine 1.019 1.005 - 1.030   pH 6.0 5.0 - 8.0   Glucose, UA NEGATIVE NEGATIVE mg/dL   Hgb urine dipstick TRACE (A) NEGATIVE   Bilirubin Urine NEGATIVE NEGATIVE   Ketones, ur NEGATIVE NEGATIVE mg/dL   Protein, ur TRACE (A) NEGATIVE mg/dL   Nitrite NEGATIVE NEGATIVE   Leukocytes,Ua NEGATIVE NEGATIVE   RBC / HPF 0-5 0 - 5 RBC/hpf   WBC, UA 0-5 0 - 5 WBC/hpf   Bacteria, UA NONE SEEN NONE SEEN   Squamous Epithelial / HPF 0-5 0 - 5 /HPF   Mucus PRESENT    Imaging Studies: CT Lumbar Spine Wo Contrast  Result Date: 11/20/2022 CLINICAL DATA:  67 year old male with low back pain since yesterday. Sciatica. EXAM: CT LUMBAR SPINE WITHOUT CONTRAST TECHNIQUE: Multidetector CT imaging of the lumbar spine was performed without intravenous contrast administration. Multiplanar CT image reconstructions were also generated. RADIATION DOSE REDUCTION: This exam was performed according to the departmental dose-optimization program which includes automated exposure control, adjustment of the mA and/or kV according to patient size and/or use of iterative reconstruction technique. COMPARISON:  Chest CT 09/26/2017.  FINDINGS: Segmentation: Normal, concordant with the 2019 visible thoracic spine. Alignment: Straightening of lumbar lordosis with underlying mild to moderate levoconvex lumbar scoliosis, apex at T2-T3. No significant spondylolisthesis. Vertebrae: No acute osseous abnormality identified. Maintained lumbar vertebral height. Intact visible sacrum and SI joints with some anterior SI joint bridging bone bilaterally. Paraspinal and other soft tissues: Normal caliber abdominal aorta with mild Aortoiliac calcified atherosclerosis. Stable visible upper abdominal viscera, including partially visible chronic renal cysts (no follow-up imaging recommended). Negative lumbar paraspinal soft tissues. Disc levels: T11-T12: Negative. T12-L1:  Negative. L1-L2: Circumferential disc bulge eccentric to the right. Broad-based partially calcified right foraminal disc series 4, image 42. No spinal stenosis but at least moderate right L1 neural foraminal stenosis. L2-L3: Chronic severe disc and endplate degeneration. Vacuum disc here since 2019. Circumferential disc osteophyte complex eccentric to the right. Mild facet and ligament flavum hypertrophy. Mild spinal stenosis with probably moderate to  severe right lateral recess stenosis (right L3 nerve level). Only mild right L2 neural foraminal stenosis. L3-L4: Disc space loss with bulky circumferential disc bulging. Disc appears eccentric to the right. Mild facet and ligament flavum hypertrophy. Mild to moderate spinal and lateral recess stenosis suspected (L4 nerve levels). And mild to moderate bilateral L3 foraminal stenosis. L4-L5: Similar circumferential disc bulge and endplate spurring with a broad-based posterior component. Mild facet and ligament flavum hypertrophy. Mild spinal and bilateral L4 foraminal stenosis suspected. L5-S1: Circumferential disc osteophyte complex eccentric to the left. Mild to moderate facet hypertrophy. No significant spinal stenosis. No obvious lateral recess  stenosis. Moderate left L5 foraminal stenosis. IMPRESSION: 1. No acute osseous abnormality in the lumbar spine. Levoconvex lumbar scoliosis with widespread chronic disc disease. 2. Multifactorial spinal stenosis suspected from L2-L3 to L4-L5, up to moderate. Right lateral recess stenosis suspected at the right L3 nerve level. And up to moderate right L1, bilateral L3, and left L5 nerve level foraminal stenosis. 3. Mild Calcified aortic atherosclerosis. Electronically Signed   By: Odessa Fleming M.D.   On: 11/20/2022 05:56    ED COURSE and MDM  Nursing notes, initial and subsequent vitals signs, including pulse oximetry, reviewed and interpreted by myself.  Vitals:   11/20/22 0535 11/20/22 0539 11/20/22 0540 11/20/22 0551  BP:    (!) 163/82  Pulse: (!) 40   75  Resp: Temp:      TempSrc:      SpO2: 98% 98% 97% 98%  Weight:      Height:       Medications  ondansetron (ZOFRAN-ODT) disintegrating tablet 8 mg (8 mg Oral Given 11/20/22 0524)  HYDROmorphone (DILAUDID) injection 1 mg (1 mg Intramuscular Given 11/20/22 0524)   The patient's EKG showed bigeminy.  He was seen by his cardiologist, Dr. Dietrich Pates, last week.  He had been on 48-hour monitor which showed frequent PVCs.  She prescribed Toprol for him but he has not yet picked up the prescription.   The patient's pain is likely due to his lumbar disc disease.  We will refer to neurosurgery should symptoms persist or worsen.  He is not having any neurologic deficits with this.  PROCEDURES  Procedures   ED DIAGNOSES     ICD-10-CM   1. Acute right-sided low back pain without sciatica  M54.50     2. DDD (degenerative disc disease), lumbar  M51.36     3. Ventricular bigeminy  I49.8          Eleonora Peeler, Jonny Ruiz, MD 11/20/22 [phone]

## 2022-11-22 DIAGNOSIS — M48062 Spinal stenosis, lumbar region with neurogenic claudication: Secondary | ICD-10-CM | POA: Diagnosis not present

## 2022-11-23 ENCOUNTER — Other Ambulatory Visit: Payer: Self-pay | Admitting: Internal Medicine

## 2022-11-23 ENCOUNTER — Telehealth: Payer: Self-pay

## 2022-11-23 DIAGNOSIS — M48062 Spinal stenosis, lumbar region with neurogenic claudication: Secondary | ICD-10-CM

## 2022-11-23 NOTE — Telephone Encounter (Signed)
        Patient  visited [facility] on 4/16   Telephone encounter attempt :  2nd  A HIPAA compliant voice message was left requesting a return call.  Instructed patient to call back .    Lenard Forth [facility] Guide, [facility] [phone] [address] [place], [place], [place] [address] Phone: [phone] Email: [email or web address]

## 2022-11-23 NOTE — Telephone Encounter (Signed)
        Patient  visited [facility] on 4/16   Telephone encounter attempt :  1st  A HIPAA compliant voice message was left requesting a return call.  Instructed patient to call back     Lenard Forth [facility] Guide, [facility] [phone] [address] [place], [place], [place] [address] Phone: [phone] Email: [email or web address]

## 2022-11-26 DIAGNOSIS — Z6826 Body mass index (BMI) 26.0-26.9, adult: Secondary | ICD-10-CM | POA: Diagnosis not present

## 2022-11-26 DIAGNOSIS — M5416 Radiculopathy, lumbar region: Secondary | ICD-10-CM | POA: Diagnosis not present

## 2022-12-04 DIAGNOSIS — M5416 Radiculopathy, lumbar region: Secondary | ICD-10-CM | POA: Diagnosis not present

## 2022-12-04 DIAGNOSIS — M47816 Spondylosis without myelopathy or radiculopathy, lumbar region: Secondary | ICD-10-CM | POA: Diagnosis not present

## 2022-12-04 DIAGNOSIS — M5126 Other intervertebral disc displacement, lumbar region: Secondary | ICD-10-CM | POA: Diagnosis not present

## 2022-12-06 DIAGNOSIS — M5416 Radiculopathy, lumbar region: Secondary | ICD-10-CM | POA: Diagnosis not present

## 2022-12-08 ENCOUNTER — Other Ambulatory Visit: Payer: Self-pay | Admitting: Internal Medicine

## 2022-12-20 DIAGNOSIS — M5416 Radiculopathy, lumbar region: Secondary | ICD-10-CM | POA: Diagnosis not present

## 2022-12-28 DIAGNOSIS — M5416 Radiculopathy, lumbar region: Secondary | ICD-10-CM | POA: Diagnosis not present

## 2023-01-15 DIAGNOSIS — M5416 Radiculopathy, lumbar region: Secondary | ICD-10-CM | POA: Diagnosis not present

## 2023-01-18 DIAGNOSIS — Z6826 Body mass index (BMI) 26.0-26.9, adult: Secondary | ICD-10-CM | POA: Diagnosis not present

## 2023-01-18 DIAGNOSIS — M5416 Radiculopathy, lumbar region: Secondary | ICD-10-CM | POA: Diagnosis not present

## 2023-02-13 ENCOUNTER — Ambulatory Visit: Payer: Medicare Other | Attending: Internal Medicine

## 2023-03-13 DIAGNOSIS — H40013 Open angle with borderline findings, low risk, bilateral: Secondary | ICD-10-CM | POA: Diagnosis not present

## 2023-03-28 DIAGNOSIS — M205X1 Other deformities of toe(s) (acquired), right foot: Secondary | ICD-10-CM | POA: Diagnosis not present

## 2023-03-28 DIAGNOSIS — M722 Plantar fascial fibromatosis: Secondary | ICD-10-CM | POA: Diagnosis not present

## 2023-03-28 DIAGNOSIS — M792 Neuralgia and neuritis, unspecified: Secondary | ICD-10-CM | POA: Diagnosis not present

## 2023-07-09 DIAGNOSIS — Z125 Encounter for screening for malignant neoplasm of prostate: Secondary | ICD-10-CM | POA: Diagnosis not present

## 2023-07-09 DIAGNOSIS — E7800 Pure hypercholesterolemia, unspecified: Secondary | ICD-10-CM | POA: Diagnosis not present

## 2023-07-09 DIAGNOSIS — I10 Essential (primary) hypertension: Secondary | ICD-10-CM | POA: Diagnosis not present

## 2023-07-10 LAB — LAB REPORT - SCANNED: EGFR: 71

## 2023-07-11 DIAGNOSIS — I2510 Atherosclerotic heart disease of native coronary artery without angina pectoris: Secondary | ICD-10-CM | POA: Diagnosis not present

## 2023-07-11 DIAGNOSIS — D485 Neoplasm of uncertain behavior of skin: Secondary | ICD-10-CM | POA: Diagnosis not present

## 2023-07-11 DIAGNOSIS — N529 Male erectile dysfunction, unspecified: Secondary | ICD-10-CM | POA: Diagnosis not present

## 2023-07-11 DIAGNOSIS — I10 Essential (primary) hypertension: Secondary | ICD-10-CM | POA: Diagnosis not present

## 2023-07-11 DIAGNOSIS — E039 Hypothyroidism, unspecified: Secondary | ICD-10-CM | POA: Diagnosis not present

## 2023-07-11 DIAGNOSIS — M5441 Lumbago with sciatica, right side: Secondary | ICD-10-CM | POA: Diagnosis not present

## 2023-07-11 DIAGNOSIS — Z8679 Personal history of other diseases of the circulatory system: Secondary | ICD-10-CM | POA: Diagnosis not present

## 2023-07-11 DIAGNOSIS — Z23 Encounter for immunization: Secondary | ICD-10-CM | POA: Diagnosis not present

## 2023-07-11 DIAGNOSIS — E7800 Pure hypercholesterolemia, unspecified: Secondary | ICD-10-CM | POA: Diagnosis not present

## 2023-07-11 DIAGNOSIS — Z0000 Encounter for general adult medical examination without abnormal findings: Secondary | ICD-10-CM | POA: Diagnosis not present

## 2023-07-11 DIAGNOSIS — R972 Elevated prostate specific antigen [PSA]: Secondary | ICD-10-CM | POA: Diagnosis not present

## 2023-07-18 DIAGNOSIS — H8110 Benign paroxysmal vertigo, unspecified ear: Secondary | ICD-10-CM | POA: Diagnosis not present

## 2023-08-26 DIAGNOSIS — Z125 Encounter for screening for malignant neoplasm of prostate: Secondary | ICD-10-CM | POA: Diagnosis not present

## 2023-09-17 DIAGNOSIS — H353132 Nonexudative age-related macular degeneration, bilateral, intermediate dry stage: Secondary | ICD-10-CM | POA: Diagnosis not present

## 2023-09-17 DIAGNOSIS — H43811 Vitreous degeneration, right eye: Secondary | ICD-10-CM | POA: Diagnosis not present

## 2023-09-17 DIAGNOSIS — H04123 Dry eye syndrome of bilateral lacrimal glands: Secondary | ICD-10-CM | POA: Diagnosis not present

## 2023-09-17 DIAGNOSIS — H40013 Open angle with borderline findings, low risk, bilateral: Secondary | ICD-10-CM | POA: Diagnosis not present

## 2023-10-18 DIAGNOSIS — E7800 Pure hypercholesterolemia, unspecified: Secondary | ICD-10-CM | POA: Diagnosis not present

## 2023-10-22 ENCOUNTER — Other Ambulatory Visit: Payer: Self-pay | Admitting: Internal Medicine

## 2023-11-10 ENCOUNTER — Other Ambulatory Visit: Payer: Self-pay | Admitting: Internal Medicine

## 2023-11-12 DIAGNOSIS — E7800 Pure hypercholesterolemia, unspecified: Secondary | ICD-10-CM | POA: Diagnosis not present

## 2023-11-30 ENCOUNTER — Other Ambulatory Visit: Payer: Self-pay | Admitting: Internal Medicine

## 2023-12-12 ENCOUNTER — Other Ambulatory Visit: Payer: Self-pay | Admitting: Internal Medicine

## 2023-12-25 ENCOUNTER — Other Ambulatory Visit: Payer: Self-pay

## 2023-12-25 ENCOUNTER — Other Ambulatory Visit (HOSPITAL_COMMUNITY): Payer: Self-pay

## 2023-12-25 MED ORDER — METOPROLOL SUCCINATE ER 25 MG PO TB24
25.0000 mg | ORAL_TABLET | Freq: Every day | ORAL | 0 refills | Status: DC
  Filled 2023-12-25: qty 15, 15d supply, fill #0

## 2024-01-01 ENCOUNTER — Other Ambulatory Visit: Payer: Self-pay

## 2024-01-06 ENCOUNTER — Other Ambulatory Visit (HOSPITAL_COMMUNITY): Payer: Self-pay

## 2024-01-07 ENCOUNTER — Other Ambulatory Visit: Payer: Self-pay

## 2024-01-07 MED ORDER — METOPROLOL SUCCINATE ER 25 MG PO TB24: 25.0000 mg | ORAL_TABLET | Freq: Every day | ORAL | 0 refills | Status: DC

## 2024-01-07 MED ORDER — AMLODIPINE BESYLATE 2.5 MG PO TABS: 2.5000 mg | ORAL_TABLET | Freq: Every day | ORAL | 0 refills | Status: DC

## 2024-01-07 MED ORDER — ROSUVASTATIN CALCIUM 20 MG PO TABS: 20.0000 mg | ORAL_TABLET | Freq: Every day | ORAL | 0 refills | Status: DC

## 2024-01-18 ENCOUNTER — Other Ambulatory Visit: Payer: Self-pay | Admitting: Internal Medicine

## 2024-02-10 ENCOUNTER — Telehealth: Payer: Self-pay

## 2024-02-10 ENCOUNTER — Other Ambulatory Visit (HOSPITAL_COMMUNITY): Payer: Self-pay

## 2024-02-10 ENCOUNTER — Ambulatory Visit: Attending: Internal Medicine

## 2024-02-10 DIAGNOSIS — E785 Hyperlipidemia, unspecified: Secondary | ICD-10-CM

## 2024-02-10 DIAGNOSIS — I10 Essential (primary) hypertension: Secondary | ICD-10-CM

## 2024-02-10 DIAGNOSIS — I4891 Unspecified atrial fibrillation: Secondary | ICD-10-CM

## 2024-02-10 DIAGNOSIS — I493 Ventricular premature depolarization: Secondary | ICD-10-CM

## 2024-02-10 DIAGNOSIS — Z79899 Other long term (current) drug therapy: Secondary | ICD-10-CM

## 2024-02-10 DIAGNOSIS — E782 Mixed hyperlipidemia: Secondary | ICD-10-CM

## 2024-02-10 MED ORDER — SILDENAFIL CITRATE 50 MG PO TABS
50.0000 mg | ORAL_TABLET | ORAL | 1 refills | Status: AC | PRN
  Filled 2024-02-10: qty 30, 30d supply, fill #0

## 2024-02-10 MED ORDER — ROSUVASTATIN CALCIUM 20 MG PO TABS
20.0000 mg | ORAL_TABLET | Freq: Every day | ORAL | 1 refills | Status: DC
  Filled 2024-02-10: qty 90, 90d supply, fill #0

## 2024-02-10 MED ORDER — AMLODIPINE BESYLATE 2.5 MG PO TABS
2.5000 mg | ORAL_TABLET | Freq: Every day | ORAL | 1 refills | Status: AC
  Filled 2024-02-10: qty 90, 90d supply, fill #0

## 2024-02-10 NOTE — Telephone Encounter (Signed)
 Patrick Vina GAILS, MD  Patrick Jenkins HERO, RN Looks like it has been a little over 1 year since follow up He should have appt     He was scheduled for a monitor after I saw him last   He never got it done   See if willing to wear a 2 day monitor prior to visit, given his hx of PVCs

## 2024-02-10 NOTE — Telephone Encounter (Signed)
 I spoke with the pt and made him an appt in August with Dr Okey and he agrees to a Zio.SABRA order placed.

## 2024-02-10 NOTE — Progress Notes (Unsigned)
 Enrolled patient for a 3 day Zio XT monitor to be mailed to patients home

## 2024-02-20 ENCOUNTER — Other Ambulatory Visit (HOSPITAL_COMMUNITY): Payer: Self-pay

## 2024-03-03 ENCOUNTER — Other Ambulatory Visit: Payer: Self-pay | Admitting: Internal Medicine

## 2024-03-03 DIAGNOSIS — E785 Hyperlipidemia, unspecified: Secondary | ICD-10-CM

## 2024-03-03 DIAGNOSIS — E782 Mixed hyperlipidemia: Secondary | ICD-10-CM

## 2024-03-03 DIAGNOSIS — I4891 Unspecified atrial fibrillation: Secondary | ICD-10-CM

## 2024-03-03 DIAGNOSIS — Z79899 Other long term (current) drug therapy: Secondary | ICD-10-CM

## 2024-03-03 DIAGNOSIS — I10 Essential (primary) hypertension: Secondary | ICD-10-CM

## 2024-03-03 DIAGNOSIS — I493 Ventricular premature depolarization: Secondary | ICD-10-CM

## 2024-03-07 ENCOUNTER — Other Ambulatory Visit: Payer: Self-pay | Admitting: Internal Medicine

## 2024-03-07 DIAGNOSIS — E782 Mixed hyperlipidemia: Secondary | ICD-10-CM

## 2024-03-07 DIAGNOSIS — Z79899 Other long term (current) drug therapy: Secondary | ICD-10-CM

## 2024-03-07 DIAGNOSIS — I493 Ventricular premature depolarization: Secondary | ICD-10-CM

## 2024-03-07 DIAGNOSIS — E785 Hyperlipidemia, unspecified: Secondary | ICD-10-CM

## 2024-03-07 DIAGNOSIS — I10 Essential (primary) hypertension: Secondary | ICD-10-CM

## 2024-03-07 DIAGNOSIS — I4891 Unspecified atrial fibrillation: Secondary | ICD-10-CM

## 2024-03-11 DIAGNOSIS — E785 Hyperlipidemia, unspecified: Secondary | ICD-10-CM | POA: Diagnosis not present

## 2024-03-11 DIAGNOSIS — I4891 Unspecified atrial fibrillation: Secondary | ICD-10-CM | POA: Diagnosis not present

## 2024-03-12 DIAGNOSIS — Z79899 Other long term (current) drug therapy: Secondary | ICD-10-CM

## 2024-03-12 DIAGNOSIS — E782 Mixed hyperlipidemia: Secondary | ICD-10-CM

## 2024-03-12 DIAGNOSIS — E785 Hyperlipidemia, unspecified: Secondary | ICD-10-CM | POA: Diagnosis not present

## 2024-03-12 DIAGNOSIS — I4891 Unspecified atrial fibrillation: Secondary | ICD-10-CM

## 2024-03-12 DIAGNOSIS — I10 Essential (primary) hypertension: Secondary | ICD-10-CM

## 2024-03-12 DIAGNOSIS — I493 Ventricular premature depolarization: Secondary | ICD-10-CM | POA: Diagnosis not present

## 2024-03-16 ENCOUNTER — Ambulatory Visit: Payer: Self-pay | Admitting: Internal Medicine

## 2024-03-17 NOTE — Progress Notes (Deleted)
 Cardiology Office Note   Date:  03/17/2024   ID:  Patrick Ferguson, DOB 03/01/1956, MRN [id number]  PCP:  Clarice Nottingham, MD  Cardiologist:   Vina Gull, MD   Pt presents for f/u of HTN    History of Present Illness: Patrick Ferguson is a 68 y.o. male with a history of HTN and atrial flutter.  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   In April 2019 underwent ablation.   REpeat echo after  LVEF 50 to 55%  Pt had episode of heart pounding in the past  Wore an event monitor   Only occaisonal PVCs, short burst PAT seen  Resolved  I last saw the pt in  Feb 2022. EKG had PVCs   Exam had frequent skps  I recomm a monitor   48 hour monitor showed 13% PVCs   Recomm Toprol  XL 25   F/U montor ordered but not done I saw the pt in JAn 2023  Another monitor showed SR with 19.8% PVCs  Placced on Toprol  XL  Since seen the pt denies palpitations.   No CP   no SOB     He is active   Recently got back from a safari in [place]  I saw the pt in April 2024   No outpatient medications have been marked as taking for the 03/19/24 encounter (Appointment) with Gull Vina GAILS, MD.     Allergies:   Sulfites   Past Medical History:  Diagnosis Date   Hypertension     Past Surgical History:  Procedure Laterality Date   A-FLUTTER ABLATION N/A 11/18/2017   Procedure: A-FLUTTER ABLATION;  Surgeon: Waddell Danelle ORN, MD;  Location: [facility] INVASIVE CV LAB;  Service: Cardiovascular;  Laterality: N/A;   CARDIOVERSION N/A 10/10/2017   Procedure: CARDIOVERSION;  Surgeon: Rolan Ezra RAMAN, MD;  Location: [facility] ENDOSCOPY;  Service: Cardiovascular;  Laterality: N/A;   COLONOSCOPY  2011   in [place], [place] -normal exam per pt   TEE WITHOUT CARDIOVERSION N/A 10/10/2017   Procedure: TRANSESOPHAGEAL ECHOCARDIOGRAM (TEE);  Surgeon: Rolan Ezra RAMAN, MD;  Location: [facility] ENDOSCOPY;  Service: Cardiovascular;  Laterality: N/A;     Social History:  The patient  reports that he has  never smoked. He has never used smokeless tobacco. He reports current alcohol use of about 4.0 standard drinks of alcohol per week. He reports that he does not use drugs.   Family History:  The patient's family history includes Heart disease in his father.    ROS:  Please see the history of present illness. All other systems are reviewed and  Negative to the above problem except as noted.    PHYSICAL EXAM: VS:  There were no vitals taken for this visit.  GEN: Pt is  in no acute distress  HEENT: normal  Neck: JVP normal   No carotid bruit Cardiac: RRR .  S1, S2.  No Murmurs   No LE  edema  Respiratory:  clear to auscultation  GI: soft, nontender, nondistended  No hepatomegaly  MS: no deformity Moving all extremities      EKG:  EKG shows NSR 78 bpm   PVCs     Lipid Panel    Component Value Date/Time   CHOL 180 01/25/2020 1002   TRIG 72 01/25/2020 1002   HDL 90 01/25/2020 1002   CHOLHDL 2.0 01/25/2020 1002   LDLCALC 77 01/25/2020 1002      Wt Readings from Last 3 Encounters:  11/20/22 194 lb (88 kg)  11/13/22 194 lb 3.2 oz (88.1 kg)  01/12/22 203 lb (92.1 kg)      ASSESSMENT AND PLAN:  1  PVCs   Pt had 19.8% burden on monitor 1  year ago  Never started b blocker  He does not sense anything    Today has PVCs on EKG    I would recomm repeat monitor to reestablish burden before treating   2  Atrial flutter Pt is s/p ablation   NO evidence of recurrence  3  Hx systolic CHF  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  The patient does not have concerns regarding medicines.  Signed, Vina Gull, MD   03/17/2024 10:20 AM    [facility] [address] [place], [place], [place]  [address] Phone: [phone]; Fax: [phone]

## 2024-03-19 ENCOUNTER — Ambulatory Visit: Admitting: Internal Medicine

## 2024-05-07 ENCOUNTER — Encounter: Payer: Self-pay | Admitting: Internal Medicine

## 2024-05-07 ENCOUNTER — Ambulatory Visit: Attending: Internal Medicine | Admitting: Internal Medicine

## 2024-05-07 VITALS — BP 160/80 | HR 67 | Ht 72.0 in | Wt 186.0 lb

## 2024-05-07 DIAGNOSIS — E785 Hyperlipidemia, unspecified: Secondary | ICD-10-CM | POA: Insufficient documentation

## 2024-05-07 DIAGNOSIS — I10 Essential (primary) hypertension: Secondary | ICD-10-CM | POA: Insufficient documentation

## 2024-05-07 DIAGNOSIS — Z131 Encounter for screening for diabetes mellitus: Secondary | ICD-10-CM | POA: Diagnosis not present

## 2024-05-07 DIAGNOSIS — I493 Ventricular premature depolarization: Secondary | ICD-10-CM | POA: Diagnosis not present

## 2024-05-07 NOTE — Patient Instructions (Addendum)
 Medication Instructions:  Your physician recommends that you continue on your current medications as directed. Please refer to the Current Medication list given to you today.  *If you need a refill on your cardiac medications before your next appointment, please call your pharmacy*  Lab Work: At your convenience: fasting labs (NMR, hemoglobin A1c) You may go to any of these [facility] locations: [facility] - [address] ([facility]) - [address] [phone] [address] (1st floor, next to pharmacy)   [place] - [address]    [place]  - [address]    [place] - [address] [employer]  - [address] ([facility])  [place]   - [address]  If you have labs (blood work) drawn today and your tests are completely normal, you will receive your results only by: [employer] (if you have MyChart) OR A paper copy in the mail If you have any lab test that is abnormal or we need to change your treatment, we will call you to review the results.  Testing/Procedures: ECHO Your physician has requested that you have an echocardiogram. Echocardiography is a painless test that uses sound waves to create images of your heart. It provides your doctor with information about the size and shape of your heart and how well your heart's chambers and valves are working. This procedure takes approximately one hour. There are no restrictions for this procedure. Please do NOT wear cologne, perfume, aftershave, or lotions (deodorant is allowed). Please arrive 15 minutes prior to your appointment time.  Please note: We ask at that you not bring children with you during ultrasound (echo/ vascular) testing. Due to room size and safety concerns, children are not allowed in the ultrasound rooms  during exams. Our front office staff cannot provide observation of children in our lobby area while testing is being conducted. An adult accompanying a patient to their appointment will only be allowed in the ultrasound room at the discretion of the ultrasound technician under special circumstances. We apologize for any inconvenience.  Follow-Up: At [facility], you and your health needs are our priority.  As part of our continuing mission to provide you with exceptional heart care, our providers are all part of one team.  This team includes your primary Cardiologist (physician) and Advanced Practice Providers or APPs (Physician Assistants and Nurse Practitioners) who all work together to provide you with the care you need, when you need it.  Your next appointment:   1 year(s)  Provider:   Vina Gull, MD   We recommend signing up for the patient portal called MyChart.  Sign up information is provided on this After Visit Summary.  MyChart is used to connect with patients for Virtual Visits (Telemedicine).  Patients are able to view lab/test results, encounter notes, upcoming appointments, etc.  Non-urgent messages can be sent to your provider as well.    To learn more about what you can do with MyChart, go to [email or web address].   Other Instructions HOW TO TAKE YOUR BLOOD PRESSURE Rest 5 minutes before taking your blood pressure. Don't  smoke or drink caffeinated beverages for at least 30 minutes before. Take your blood pressure before (not after) you eat. Sit comfortably with your back supported and both feet on the  floor ( don't cross your legs). Elevate your arm to heart level on a table or a desk. Use the proper sized cuff.  It should fit smoothly and snugly around your bare upper arm. There should be enough room to slip a fingertip under the cuff.  The bottom edge of the cuff should be 1 inch above the crease of the elbow.  Please monitor your blood pressure once daily 2  hours after your am medication. If you blood pressure consistently remains above upper 130's/140+ (systolic) top number or over 90 ( diastolic) bottom number X 3 days consecutively.  Please call our office at [phone] or send Mychart message.

## 2024-05-07 NOTE — Progress Notes (Signed)
 Cardiology Office Note   Date:  05/07/2024  ID:  Patrick Ferguson, DOB 29-Sep-1955, MRN [id number]  PCP:  Clarice Nottingham, MD  Cardiologist:   Vina Gull, MD   Pt presents for f/u of HTN    History of Present Illness: Patrick Ferguson is a 68 y.o. male with a history of HTN and atrial flutter.  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   In April 2019 underwent ablation.   REpeat echo after  LVEF 50 to 55%  Pt had episode of heart pounding in the past  Wore an event monitor   Only occaisonal PVCs, short burst PAT seen  Resolved  I last saw the pt in  Feb 2022. EKG had PVCs   Exam had frequent skps  I recomm a monitor   48 hour monitor showed 13% PVCs   Recomm Toprol  XL 25   F/U montor ordered but not done I saw the pt in JAn 2023  Another monitor showed SR with 19.8% PVCs  Placced on Toprol  XL  Since seen the pt denies palpitations.   No CP   no SOB     He is active   Recently got back from a safari in [place]  I saw the pt in April 2024   Since seen the pt denies CP  Breathing is good   He notes rare palpitation.  No dizziness     Current Meds  Medication Sig   metoprolol  succinate (TOPROL -XL) 25 MG 24 hr tablet TAKE 1 TABLET BY MOUTH DAILY   rosuvastatin  (CRESTOR ) 20 MG tablet TAKE 1 TABLET BY MOUTH DAILY   sildenafil  (VIAGRA ) 50 MG tablet Take 1-2 tablets (50-100 mg total) by mouth as needed.     Allergies:   Sulfites   Past Medical History:  Diagnosis Date   Hypertension     Past Surgical History:  Procedure Laterality Date   A-FLUTTER ABLATION N/A 11/18/2017   Procedure: A-FLUTTER ABLATION;  Surgeon: Waddell Danelle ORN, MD;  Location: [facility] INVASIVE CV LAB;  Service: Cardiovascular;  Laterality: N/A;   CARDIOVERSION N/A 10/10/2017   Procedure: CARDIOVERSION;  Surgeon: Rolan Ezra RAMAN, MD;  Location: [facility] ENDOSCOPY;  Service: Cardiovascular;  Laterality: N/A;   COLONOSCOPY  2011   in [place], [place] -normal exam per pt    TEE WITHOUT CARDIOVERSION N/A 10/10/2017   Procedure: TRANSESOPHAGEAL ECHOCARDIOGRAM (TEE);  Surgeon: Rolan Ezra RAMAN, MD;  Location: [facility] ENDOSCOPY;  Service: Cardiovascular;  Laterality: N/A;     Social History:  The patient  reports that he has never smoked. He has never used smokeless tobacco. He reports current alcohol use of about 4.0 standard drinks of alcohol per week. He reports that he does not use drugs.   Family History:  The patient's family history includes Heart disease in his father.    ROS:  Please see the history of present illness. All other systems are reviewed and  Negative to the above problem except as noted.    PHYSICAL EXAM: VS:  BP (!) 160/80   Pulse 67   Ht 6' (1.829 m)   Wt 186 lb (84.4 kg)   SpO2 98%   BMI 25.23 kg/m   GEN: Pt is  in no acute distress  Neck: JVP normal   No carotid bruits Cardiac: RRR .  S1, S2.  No Murmur Respiratory:  clear to auscultation bilaterally  GI: soft, nontender, no masses  No hepatomegaly  Ext  No LE edema  EKG:  EKG shows NSR  with frequent PVCs (bigeminy)   67 bpm  LAD.    LVH   Lipid Panel    Component Value Date/Time   CHOL 180 01/25/2020 1002   TRIG 72 01/25/2020 1002   HDL 90 01/25/2020 1002   CHOLHDL 2.0 01/25/2020 1002   LDLCALC 77 01/25/2020 1002      Wt Readings from Last 3 Encounters:  05/07/24 186 lb (84.4 kg)  11/20/22 194 lb (88 kg)  11/13/22 194 lb 3.2 oz (88.1 kg)      ASSESSMENT AND PLAN:  1  PVCs   Pt has a history of frequent PVCs  Again, he doees not sense them   He had a high burden in the past (19.8%)  Started on b blocker      Now recent monitor shows SR with 17% PVC burden  I would recomm an echo to reevaluate LV function    Consider increase in b blocker Rx     2  Atrial flutter Pt is s/p ablation   NO recurrence  3  Hx HFrEF  Most likely tachy induced as LVEF normalized after ablation    Repeat  echo to reevlauate   3  CAD  Pt with evid of CAD with calcifications of  LAD on CT from 2019   Denies CP  Follow    4  Hx HTNe   BP is elevated today   I have asked him to get BP cuff and monitor at home   Goal 110s to 120s    Message with readings   5  Lipids  On Crestor  20    Last LDL 81  HDL 65   (March 2025)   Will get fasting NMR panel as well as Hgb A1C      Current medicines are reviewed at length with the patient today.  The patient does not have concerns regarding medicines.  Signed, Vina Gull, MD

## 2024-05-14 DIAGNOSIS — Z131 Encounter for screening for diabetes mellitus: Secondary | ICD-10-CM | POA: Diagnosis not present

## 2024-05-14 DIAGNOSIS — I10 Essential (primary) hypertension: Secondary | ICD-10-CM | POA: Diagnosis not present

## 2024-05-14 DIAGNOSIS — E785 Hyperlipidemia, unspecified: Secondary | ICD-10-CM | POA: Diagnosis not present

## 2024-05-14 DIAGNOSIS — I493 Ventricular premature depolarization: Secondary | ICD-10-CM | POA: Diagnosis not present

## 2024-05-15 LAB — NMR, LIPOPROFILE
Cholesterol, Total: 183 mg/dL (ref 100–199)
HDL Particle Number: 37.5 umol/L (ref >=30.5)
HDL-C: 82 mg/dL (ref >39)
LDL Particle Number: 640 nmol/L (ref <1000)
LDL Size: 21.1 nm (ref >20.5)
LDL-C (NIH Calc): 86 mg/dL (ref 0–99)
LP-IR Score: 27 (ref <=45)
Small LDL Particle Number: <90 nmol/L (ref <=527)
Triglycerides: 86 mg/dL (ref 0–149)

## 2024-05-15 LAB — HEMOGLOBIN A1C
Est. average glucose Bld gHb Est-mCnc: 105 mg/dL
Hgb A1c MFr Bld: 5.3 % (ref 4.8–5.6)

## 2024-05-19 ENCOUNTER — Ambulatory Visit: Payer: Self-pay | Admitting: Internal Medicine

## 2024-05-19 DIAGNOSIS — I4891 Unspecified atrial fibrillation: Secondary | ICD-10-CM

## 2024-05-19 DIAGNOSIS — I10 Essential (primary) hypertension: Secondary | ICD-10-CM

## 2024-05-19 DIAGNOSIS — I493 Ventricular premature depolarization: Secondary | ICD-10-CM

## 2024-06-09 ENCOUNTER — Other Ambulatory Visit: Payer: Self-pay | Admitting: Internal Medicine

## 2024-06-18 ENCOUNTER — Ambulatory Visit (HOSPITAL_COMMUNITY)
Admission: RE | Admit: 2024-06-18 | Discharge: 2024-06-18 | Disposition: A | Discharge status: Home / Self Care | Source: Ambulatory Visit | Attending: Cardiology | Admitting: Cardiology

## 2024-06-18 DIAGNOSIS — I10 Essential (primary) hypertension: Secondary | ICD-10-CM | POA: Diagnosis not present

## 2024-06-18 DIAGNOSIS — I493 Ventricular premature depolarization: Secondary | ICD-10-CM | POA: Diagnosis not present

## 2024-06-18 LAB — ECHOCARDIOGRAM COMPLETE
AR max vel: 1.95 cm2
AV Area VTI: 1.95 cm2
AV Area mean vel: 2.01 cm2
AV Mean grad: 6 mmHg
AV Peak grad: 12 mmHg
Ao pk vel: 1.73 m/s
Area-P 1/2: 2.31 cm2
Calc EF: 46.4 %
S' Lateral: 4.5 cm
Single Plane A2C EF: 53.2 %
Single Plane A4C EF: 39.3 %

## 2024-07-09 DIAGNOSIS — E7800 Pure hypercholesterolemia, unspecified: Secondary | ICD-10-CM | POA: Diagnosis not present

## 2024-07-09 DIAGNOSIS — I10 Essential (primary) hypertension: Secondary | ICD-10-CM | POA: Diagnosis not present

## 2024-07-09 DIAGNOSIS — Z125 Encounter for screening for malignant neoplasm of prostate: Secondary | ICD-10-CM | POA: Diagnosis not present

## 2024-07-10 ENCOUNTER — Encounter: Payer: Self-pay | Admitting: Internal Medicine

## 2024-07-10 DIAGNOSIS — I4891 Unspecified atrial fibrillation: Secondary | ICD-10-CM

## 2024-07-10 DIAGNOSIS — I493 Ventricular premature depolarization: Secondary | ICD-10-CM

## 2024-07-13 DIAGNOSIS — N529 Male erectile dysfunction, unspecified: Secondary | ICD-10-CM | POA: Diagnosis not present

## 2024-07-13 DIAGNOSIS — I493 Ventricular premature depolarization: Secondary | ICD-10-CM | POA: Diagnosis not present

## 2024-07-13 DIAGNOSIS — E7800 Pure hypercholesterolemia, unspecified: Secondary | ICD-10-CM | POA: Diagnosis not present

## 2024-07-13 DIAGNOSIS — Z0000 Encounter for general adult medical examination without abnormal findings: Secondary | ICD-10-CM | POA: Diagnosis not present

## 2024-07-13 DIAGNOSIS — Z9102 Food additives allergy status: Secondary | ICD-10-CM | POA: Diagnosis not present

## 2024-07-13 DIAGNOSIS — I10 Essential (primary) hypertension: Secondary | ICD-10-CM | POA: Diagnosis not present

## 2024-07-13 DIAGNOSIS — I2510 Atherosclerotic heart disease of native coronary artery without angina pectoris: Secondary | ICD-10-CM | POA: Diagnosis not present

## 2024-07-13 DIAGNOSIS — E039 Hypothyroidism, unspecified: Secondary | ICD-10-CM | POA: Diagnosis not present

## 2024-07-13 DIAGNOSIS — Z8679 Personal history of other diseases of the circulatory system: Secondary | ICD-10-CM | POA: Diagnosis not present

## 2024-07-13 DIAGNOSIS — K429 Umbilical hernia without obstruction or gangrene: Secondary | ICD-10-CM | POA: Diagnosis not present

## 2024-07-13 NOTE — Telephone Encounter (Signed)
 I would recomm a referral to EP   Recomm Patrick Ferguson or Patrick Ferguson Regarding PVCs and other therapies  I do want him to have a limited echo with Def[facility] which was ordered

## 2024-07-14 NOTE — Telephone Encounter (Signed)
 Called and spoke to daughter, Harlene. Pt and spouse are currently traveling. They plan to be back in [place] on Friday 07/17/24. So the Echo would need to be done week of 07/20/24. Reviewed Dr. Nada recommendations regarding a Limited Echo and EP referral. She verbalized understanding. Advised her that Dr. Okey would review Limited Echo results and that pt would need to see EP provider also to know about a plan going forward. (Was initially requesting either a Phone Call or Virtual Visit to get more information on what is happening regarding Cardiac status)

## 2024-07-14 NOTE — Telephone Encounter (Signed)
 Called [facility] hospital Echo lab at [phone]. They do have an opening but it is at [facility] in [place] for 07/16/24 at 3:00 pm or 4:00 pm. LVMTCB on 07/14/24. Will ask Olam Ford to place pt on Wait List.  Also will route to EP scheduling for appt with either Kennyth or [place].

## 2024-08-11 ENCOUNTER — Encounter (HOSPITAL_COMMUNITY): Payer: Self-pay | Admitting: *Deleted

## 2024-08-11 ENCOUNTER — Ambulatory Visit (HOSPITAL_COMMUNITY)
Admission: RE | Admit: 2024-08-11 | Discharge: 2024-08-11 | Disposition: A | Discharge status: Home / Self Care | Source: Ambulatory Visit | Attending: Cardiology | Admitting: Cardiology

## 2024-08-11 DIAGNOSIS — I493 Ventricular premature depolarization: Secondary | ICD-10-CM | POA: Diagnosis present

## 2024-08-11 DIAGNOSIS — I10 Essential (primary) hypertension: Secondary | ICD-10-CM | POA: Diagnosis present

## 2024-08-11 DIAGNOSIS — I4891 Unspecified atrial fibrillation: Secondary | ICD-10-CM | POA: Diagnosis present

## 2024-08-11 LAB — ECHOCARDIOGRAM LIMITED
Area-P 1/2: 1.61 cm2
Est EF: 45
S' Lateral: 4.2 cm

## 2024-08-11 MED ORDER — PERFLUTREN LIPID MICROSPHERE
1.0000 mL | INTRAVENOUS | Status: AC | PRN
  Administered 2024-08-11: 1 mL via INTRAVENOUS

## 2024-08-11 NOTE — Progress Notes (Signed)
 Patient ID: Patrick Ferguson, male   DOB: [birth year], 69 y.o.   MRN: [id number] patient arrive for Echo with elevated Blood pressures  these two before definity  172/101, 181/111 and this one after 179/111. Patient was asymptomatic upon discharge

## 2024-08-14 ENCOUNTER — Ambulatory Visit: Payer: Self-pay | Admitting: Internal Medicine

## 2024-08-18 ENCOUNTER — Telehealth: Payer: Self-pay | Admitting: Internal Medicine

## 2024-08-18 NOTE — Telephone Encounter (Signed)
 Pt would like a c/b regarding results from echo, please advise.

## 2024-08-18 NOTE — Telephone Encounter (Signed)
 Spoke with pt. Pt believed he had a appointment for next week. Confirmed he had a appt 08/25/24 with Dr Kennyth per Dr Okey. Pt stated understanding.

## 2024-08-24 NOTE — Progress Notes (Unsigned)
 " Electrophysiology Office Note:   Date:  08/26/2024  ID:  Patrick Ferguson, DOB 1956/04/10, MRN [id number]  Primary Cardiologist: Vina Gull, MD Electrophysiologist: Fonda Kitty, MD      History of Present Illness:   Patrick Ferguson is a 69 y.o. male with h/o HTN, atrial flutter and PVCs who is being who is being seen today for PVCs.  EP History: LVEF 30% when diagnosed. Believed to be tachyinduced. Underwent TEE cardioverison, then developed recurrence. In April 2019 underwent ablation. Repeat echo after LVEF 50 to 55%   Discussed the use of AI scribe software for clinical note transcription with the patient, who gave verbal consent to proceed.  History of Present Illness Patrick Ferguson is a 69 year old male with atrial flutter who presents with premature ventricular contractions (PVCs). He is accompanied by his wife, Patrick Ferguson.  He has a history of atrial flutter, previously treated with an ablation procedure. Currently, he is experiencing premature ventricular contractions (PVCs), described as occasional extra beats. No palpitations, dizziness, lightheadedness, shortness of breath, or chest pain. His daughter is concerned about the potential for these PVCs to cause cardiomyopathy or weakening of the heart muscle.  Despite this, he feels good overall but has reduced his exercise routine due to uncertainty about his capacity and potential limitations. He has not experienced any exercise intolerance or limitations other than those he imposed on himself due to age.  He is currently taking metoprolol  25 mg once daily in the morning. He has not increased the dose to 50 mg because his blood pressure has been excellent, and he has not experienced any adverse symptoms on the current dose. He takes all his medications at the same time in the morning.  His family history includes a daughter who is an OBGYN in [place], and she has been involved in discussions about his heart condition.    Review of  systems complete and found to be negative unless listed in HPI.   EP Information / Studies Reviewed:    EKG is ordered today. Personal review as below.  EKG Interpretation Date/Time:  Tuesday August 25 2024 10:20:17 EST Ventricular Rate:  76 PR Interval:  196 QRS Duration:  98 QT Interval:  426 QTC Calculation: 479 R Axis:   18  Text Interpretation: Sinus rhythm with sinus arrhythmia with frequent and consecutive Premature ventricular complexes T wave abnormality, consider inferior ischemia When compared with ECG of 07-May-2024 11:32, No significant change was found Confirmed by Kitty Fonda [phone]) on 08/26/2024 2:33:53 PM   ECG 05/07/24: SR with bigeminal PVCs   [facility]io 02/28/24:   Echo 08/11/24:  1. Definity  used. Left ventricular ejection fraction, by estimation, is  45%. The left ventricle demonstrates global hypokinesis. The left  ventricular internal cavity size was mildly to moderately dilated.   2. Right ventricular systolic function is low normal. The right  ventricular size is normal. There is normal pulmonary artery systolic  pressure.   3. The mitral valve is normal in structure. Mild mitral valve  regurgitation.   4. The aortic valve is tricuspid. Aortic valve sclerosis/calcification is  present, without any evidence of aortic stenosis.   03/12/24:  Patch Wear Time:  3 days and 0 hours (2025-07-28T05:52:38-0400 to 2025-07-31T06:45:45-0400)   Impression:  SInus rhythm  Rates 41 to 133 bpm  Average HR 66 bpm Rare PACs   Frequent PVCs (17% total)   Occasional couplet (2.2% total)   1 run SVT lasting 5 beats at max rate of 133 bpm  2 diary entries  One correlated with SR.  One with SR with PVC.      Physical Exam:   VS:  BP 128/76   Pulse 76   Ht 6' (1.829 m)   Wt 191 lb 12.8 oz (87 kg)   SpO2 97%   BMI 26.01 kg/m    Wt Readings from Last 3 Encounters:  08/25/24 191 lb 12.8 oz (87 kg)  05/07/24 186 lb (84.4 kg)  11/20/22 194 lb (88 kg)     GEN: Well  nourished, well developed in no acute distress NECK: No JVD CARDIAC: Normal rate, irregular RESPIRATORY:  Clear to auscultation without rales, wheezing or rhonchi  ABDOMEN: Soft, non-distended EXTREMITIES:  No edema; No deformity   ASSESSMENT AND PLAN:    #PVCs: Patient has frequent PVCs. He is asymptomatic. His EF is relatively stable at 45%. He has no clinical signs or symptoms of heart failure. At this point, it is unlikely that benefits of anti-arrhythmic therapy or ablation outweigh risks. We will continue his beta-blocker therapy and obtain cardiac stress MRI to assess for ischemia (known coronary calcifications), presence of scar, and LVEF more accurately.   #Atrial flutter s/p ablation:  No known recurrence.   #HFmrEF:Well compensated.  - MRI as above.   Follow up with EP Team in 8 weeks.    Signed, Fonda Kitty, MD  "

## 2024-08-25 ENCOUNTER — Encounter: Payer: Self-pay | Admitting: Cardiology

## 2024-08-25 ENCOUNTER — Ambulatory Visit: Attending: Cardiology | Admitting: Cardiology

## 2024-08-25 VITALS — BP 128/76 | HR 76 | Ht 72.0 in | Wt 191.8 lb

## 2024-08-25 DIAGNOSIS — I493 Ventricular premature depolarization: Secondary | ICD-10-CM | POA: Insufficient documentation

## 2024-08-25 DIAGNOSIS — I5022 Chronic systolic (congestive) heart failure: Secondary | ICD-10-CM | POA: Diagnosis not present

## 2024-08-25 DIAGNOSIS — I483 Typical atrial flutter: Secondary | ICD-10-CM | POA: Diagnosis not present

## 2024-08-25 DIAGNOSIS — E785 Hyperlipidemia, unspecified: Secondary | ICD-10-CM

## 2024-08-25 DIAGNOSIS — I10 Essential (primary) hypertension: Secondary | ICD-10-CM

## 2024-08-25 DIAGNOSIS — I4891 Unspecified atrial fibrillation: Secondary | ICD-10-CM

## 2024-08-25 NOTE — Patient Instructions (Signed)
 Medication Instructions:  Your physician recommends that you continue on your current medications as directed. Please refer to the Current Medication list given to you today.  *If you need a refill on your cardiac medications before your next appointment, please call your pharmacy*  Lab Work: CBC  Testing/Procedures: Cardiac Stress MRI - please see below instructions  Follow-Up: At [facility], you and your health needs are our priority.  As part of our continuing mission to provide you with exceptional heart care, our providers are all part of one team.  This team includes your primary Cardiologist (physician) and Advanced Practice Providers or APPs (Physician Assistants and Nurse Practitioners) who all work together to provide you with the care you need, when you need it.  Your next appointment:   8-10 weeks  Provider:   Fonda Kitty, MD     Other Instructions   You are scheduled for Cardiac MRI at the location below.  Please arrive for your appointment at ______________ . ?  [facility] [address] [place], [place] [address] Please take advantage of the free valet parking available at the [facility] and [employer] (Entrance C).  Proceed to the [facility] Radiology Department (First Floor) for check-in.   OR   [facility] [address] [place], [place] [address] Please go to the [facility] and check-in with the desk attendant.   Magnetic resonance imaging (MRI) is a painless test that produces images of the inside of the body without using Xrays.  During an MRI, strong magnets and radio waves work together in a data processing manager to form detailed images.   MRI images may provide more details about a medical condition than X-rays, CT scans, and ultrasounds can provide.  You may be given earphones to listen for instructions.  You may eat a light breakfast and take medications as ordered with the exception of  furosemide, hydrochlorothiazide, chlorthalidone or spironolactone (or any other fluid pill). If you are undergoing a stress MRI, please avoid stimulants for 12 hr prior to test. (I.e. Caffeine, nicotine, chocolate, or antihistamine medications)  If your provider has ordered anti-anxiety medications for this test, then you will need a driver.  An IV will be inserted into one of your veins. Contrast material will be injected into your IV. It will leave your body through your urine within a day. You may be told to drink plenty of fluids to help flush the contrast material out of your system.  You will be asked to remove all metal, including: Watch, jewelry, and other metal objects including hearing aids, hair pieces and dentures. Also wearable glucose monitoring systems (ie. Freestyle Libre and Omnipods) (Braces and fillings normally are not a problem.)   TEST WILL TAKE APPROXIMATELY 1 HOUR  PLEASE NOTIFY SCHEDULING AT LEAST 24 HOURS IN ADVANCE IF YOU ARE UNABLE TO KEEP YOUR APPOINTMENT. [phone]  For more information and frequently asked questions, please visit our website : [email or web address]  Please call the Cardiac Imaging Nurse Navigators with any questions/concerns. [phone] Office

## 2024-09-18 ENCOUNTER — Ambulatory Visit (HOSPITAL_COMMUNITY)

## 2024-09-25 ENCOUNTER — Ambulatory Visit (HOSPITAL_COMMUNITY)

## 2024-11-17 ENCOUNTER — Ambulatory Visit: Admitting: Cardiology
